# Patient Record
Sex: Male | Born: 1995
Health system: Southern US, Community
[De-identification: ages and names within clinical notes are randomized; demographics above are authoritative.]

## PROBLEM LIST (undated history)

## (undated) DIAGNOSIS — L509 Urticaria, unspecified: Secondary | ICD-10-CM

## (undated) DIAGNOSIS — R519 Headache, unspecified: Secondary | ICD-10-CM

## (undated) DIAGNOSIS — L309 Dermatitis, unspecified: Secondary | ICD-10-CM

## (undated) DIAGNOSIS — R51 Headache: Secondary | ICD-10-CM

## (undated) DIAGNOSIS — J45909 Unspecified asthma, uncomplicated: Secondary | ICD-10-CM

## (undated) DIAGNOSIS — A159 Respiratory tuberculosis unspecified: Secondary | ICD-10-CM

## (undated) HISTORY — DX: Headache, unspecified: R51.9

## (undated) HISTORY — DX: Unspecified asthma, uncomplicated: J45.909

## (undated) HISTORY — DX: Urticaria, unspecified: L50.9

## (undated) HISTORY — DX: Dermatitis, unspecified: L30.9

## (undated) HISTORY — PX: NO PAST SURGERIES: SHX2092

## (undated) HISTORY — DX: Respiratory tuberculosis unspecified: A15.9

## (undated) HISTORY — DX: Headache: R51

---

## 2016-04-21 ENCOUNTER — Encounter: Payer: Self-pay | Admitting: Family Medicine

## 2016-04-21 ENCOUNTER — Ambulatory Visit (INDEPENDENT_AMBULATORY_CARE_PROVIDER_SITE_OTHER): Payer: Self-pay | Admitting: Family Medicine

## 2016-04-21 VITALS — BP 138/80 | HR 75 | Resp 12 | Ht 73.0 in | Wt 185.1 lb

## 2016-04-21 DIAGNOSIS — F524 Premature ejaculation: Secondary | ICD-10-CM

## 2016-04-21 DIAGNOSIS — M791 Myalgia, unspecified site: Secondary | ICD-10-CM

## 2016-04-21 DIAGNOSIS — J45998 Other asthma: Secondary | ICD-10-CM

## 2016-04-21 DIAGNOSIS — N4830 Priapism, unspecified: Secondary | ICD-10-CM

## 2016-04-21 DIAGNOSIS — R5383 Other fatigue: Secondary | ICD-10-CM

## 2016-04-21 LAB — COMPREHENSIVE METABOLIC PANEL
ALT: 17 U/L (ref 0–53)
AST: 23 U/L (ref 0–37)
Albumin: 4.8 g/dL (ref 3.5–5.2)
Alkaline Phosphatase: 83 U/L (ref 39–117)
BUN: 9 mg/dL (ref 6–23)
CHLORIDE: 103 meq/L (ref 96–112)
CO2: 30 mEq/L (ref 19–32)
Calcium: 10 mg/dL (ref 8.4–10.5)
Creatinine, Ser: 0.98 mg/dL (ref 0.40–1.50)
GFR: 102.36 mL/min (ref >60.00)
GLUCOSE: 74 mg/dL (ref 70–99)
POTASSIUM: 4.1 meq/L (ref 3.5–5.1)
SODIUM: 138 meq/L (ref 135–145)
Total Bilirubin: 0.7 mg/dL (ref 0.2–1.2)
Total Protein: 7.8 g/dL (ref 6.0–8.3)

## 2016-04-21 LAB — URINALYSIS, ROUTINE W REFLEX MICROSCOPIC
Bilirubin Urine: NEGATIVE
HGB URINE DIPSTICK: NEGATIVE
Ketones, ur: NEGATIVE
LEUKOCYTES UA: NEGATIVE
NITRITE: NEGATIVE
SPECIFIC GRAVITY, URINE: 1.01 (ref 1.000–1.030)
Total Protein, Urine: NEGATIVE
URINE GLUCOSE: NEGATIVE
UROBILINOGEN UA: 0.2 (ref 0.0–1.0)
pH: 7 (ref 5.0–8.0)

## 2016-04-21 LAB — CBC WITH DIFFERENTIAL/PLATELET
BASOS PCT: 0.8 % (ref 0.0–3.0)
Basophils Absolute: 0 10*3/uL (ref 0.0–0.1)
Eosinophils Absolute: 0.3 10*3/uL (ref 0.0–0.7)
Eosinophils Relative: 7.9 % — ABNORMAL HIGH (ref 0.0–5.0)
HCT: 45.9 % (ref 39.0–52.0)
Hemoglobin: 15.6 g/dL (ref 13.0–17.0)
LYMPHS ABS: 1.8 10*3/uL (ref 0.7–4.0)
Lymphocytes Relative: 42.5 % (ref 12.0–46.0)
MCHC: 34 g/dL (ref 30.0–36.0)
MCV: 85.7 fl (ref 78.0–100.0)
Monocytes Absolute: 0.5 10*3/uL (ref 0.1–1.0)
Monocytes Relative: 11 % (ref 3.0–12.0)
NEUTROS ABS: 1.6 10*3/uL (ref 1.4–7.7)
NEUTROS PCT: 37.8 % — AB (ref 43.0–77.0)
Platelets: 234 10*3/uL (ref 150.0–400.0)
RBC: 5.36 Mil/uL (ref 4.22–5.81)
RDW: 13.6 % (ref 11.5–15.5)
WBC: 4.3 10*3/uL (ref 4.0–10.5)

## 2016-04-21 LAB — TSH: TSH: 3.47 u[IU]/mL (ref 0.35–4.50)

## 2016-04-21 MED ORDER — SERTRALINE HCL 50 MG PO TABS: 50.0000 mg | ORAL_TABLET | Freq: Every day | ORAL | 2 refills | Status: DC

## 2016-04-21 MED ORDER — ALBUTEROL SULFATE HFA 108 (90 BASE) MCG/ACT IN AERS: 2.0000 | INHALATION_SPRAY | Freq: Four times a day (QID) | RESPIRATORY_TRACT | 0 refills | Status: DC | PRN

## 2016-04-21 NOTE — Patient Instructions (Addendum)
A few things to remember from today's visit:   Myalgia - Plan: CBC with Differential/Platelet, Comprehensive metabolic panel, TSH, Urinalysis, Routine w reflex microscopic  Premature ejaculation  Painful penile erection - Plan: Chlamydia/Gonococcus/Trichomonas, NAA  Fatigue, unspecified type - Plan: CBC with Differential/Platelet, Comprehensive metabolic panel, TSH, Urinalysis, Routine w reflex microscopic  Start Sertraline 1/2 tab for a week then increase to the whole tab.  If you change your mind about urology referral please let me know.    Please be sure medication list is accurate. If a new problem present, please set up appointment sooner than planned today.

## 2016-04-21 NOTE — Progress Notes (Signed)
Pre visit review using our clinic review tool, if applicable. No additional management support is needed unless otherwise documented below in the visit note. 

## 2016-04-21 NOTE — Progress Notes (Signed)
HPI:   [name] is a 21 y.o. male, who is here today to establish care with me.  Former PCP: N/A, he has ben in [place] since 06/2015,moved from [place] Last preventive routine visit: Has not had one in may years.  Chronic medical problems: Asthma. For the past 4 months symptoms have been worse. + Nocturnal wheezing. He denies cough or dyspnea. He does not have Albuterol inh.  No recent illness.  Concerns today:   -Abdominal pain: 5 years of epigastric abdominal pain, intermittent, "soreness." Occasionally he has nausea.  He has not identified exacerbating or alleviating factors. "Not bad",it can happen q 2 weeks and months in between with no symptoms. States that he feels like he needs to scratch under his skin, no rash or skin lesions. No numbness or tingling. It seems to be stable.   Denies vomiting, changes in bowel habits, blood in stool or melena.  He has not tried OTC medication.  -Myalgias and fatigue:  Achy sensation "all over",usually exacerbated by moderate physical activity the day before symptoms onset. 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 States that sometimes this even happen with no physical activity at all. He denies joint edema or erythema. Denies limitation of ROM. He exercises regularly.  He denies Hx of sickle cells disease.   -"Genital" problems: He states that at age [age] his external genitals stopped "developing." He thinks his penis is small, he has pubic hair. 5-6 years of testicular pain, intermittently, exacerbated by palpation and by erections. He describes pain as "bad", he denies edema or erythema, he has not noted genital lesions, he denies any history of STDs.  He is also complaining of premature ejaculation, states that he has not been able to have sex intercourse. He denies urethral discharge or blood in semen. He denies dysuria,gross hematuria,or other urinary  symptoms. Denies decreased libido.  He denies anxiety or depression.  FHx negative for congenital muscle disease.   Review of Systems  Constitutional: Positive for fatigue. Negative for appetite change and fever.  HENT: Negative for mouth sores, nosebleeds and trouble swallowing.   Respiratory: Positive for wheezing. Negative for cough and shortness of breath.   Cardiovascular: Negative for chest pain, palpitations and leg swelling.  Gastrointestinal: Positive for abdominal pain and nausea. Negative for blood in stool and vomiting.       No changes in bowel habits.  Endocrine: Negative for cold intolerance and heat intolerance.  Genitourinary: Positive for testicular pain. Negative for decreased urine volume, discharge, dysuria, flank pain, frequency, genital sores, hematuria, scrotal swelling and urgency.  Musculoskeletal: Positive for myalgias. Negative for back pain and gait problem.  Skin: Negative for pallor and rash.  Allergic/Immunologic: Positive for environmental allergies.  Neurological: Negative for syncope, weakness and headaches.  Hematological: Negative for adenopathy. Does not bruise/bleed easily.  Psychiatric/Behavioral: Negative for confusion and sleep disturbance. The patient is nervous/anxious.     No current outpatient prescriptions on file prior to visit.   No current facility-administered medications on file prior to visit.      Past Medical History:  Diagnosis Date  . Asthma   . Frequent headaches    Not on File  Family History  Problem Relation Age of Onset  . Cancer Neg Hx   . Heart disease Neg Hx   . Hypertension Neg Hx   . Hyperlipidemia Neg Hx   . Depression Neg Hx     Social  History   Social History  . Marital status: Single    Spouse name: N/A  . Number of children: N/A  . Years of education: N/A   Social History Main Topics  . Smoking status: Never Smoker  . Smokeless tobacco: Never Used  . Alcohol use No  . Drug use: No  .  Sexual activity: Not Currently   Other Topics Concern  . None   Social History Narrative  . None    Vitals:   04/21/16 1152  BP: 138/80  Pulse: 75  Resp: 12   O2 sat at RA 98%. Body mass index is 24.42 kg/m.  Physical Exam  Nursing note and vitals reviewed. Constitutional: He is oriented to person, place, and time. He appears well-developed and well-nourished. No distress.  HENT:  Head: Atraumatic.  Eyes: Conjunctivae and EOM are normal.  Neck: No tracheal deviation present. No thyroid mass and no thyromegaly present.  Cardiovascular:  Pulses:      Dorsalis pedis pulses are 2+ on the right side, and 2+ on the left side.  Respiratory: Effort normal and breath sounds normal. No respiratory distress.  GI: Soft. He exhibits no mass. There is no hepatomegaly. There is no tenderness. There is no CVA tenderness. Hernia confirmed negative in the right inguinal area and confirmed negative in the left inguinal area.  Genitourinary: Penis normal. Right testis shows no mass, no swelling and no tenderness. Left testis shows no mass, no swelling and no tenderness. No penile tenderness. No discharge found.  Musculoskeletal: Normal range of motion. He exhibits no edema.  No tenderness upon palpation of paraspinal muscles (cervical,thoracic,or lumbar), no trigger points. No signs of synovitis.  Lymphadenopathy:    He has no cervical adenopathy.       Right: No inguinal and no supraclavicular adenopathy present.       Left: No inguinal and no supraclavicular adenopathy present.  Neurological: He is alert and oriented to person, place, and time. He has normal strength. No cranial nerve deficit. Coordination and gait normal.  Skin: Skin is warm. No rash noted. No erythema.  Psychiatric: His mood appears anxious.  Well groomed,good eye contact.    ASSESSMENT AND PLAN:   Jeffrey Hall was seen today for establish care.  Diagnoses and all orders for this visit:  Myalgia  Musculoskeletal  examination today normal otherwise. Further recommendations will be given according to lab results. Zoloft may help.  -     CBC with Differential/Platelet -     Comprehensive metabolic panel -     TSH -     Urinalysis, Routine w reflex microscopic  Premature ejaculation  He agrees with trying Zoloft, instructed to start 1/2 tab for a week and increase to 50 mg if well tolerated. some side effects discussed. F/U in 2 months.   -     sertraline (ZOLOFT) 50 MG tablet; Take 1 tablet (50 mg total) by mouth daily.  Painful penile erection  No pain on examination today. He has no health insurance,so he prefers to hold on urology evaluation.  -     Chlamydia/Gonococcus/Trichomonas, NAA  Fatigue, unspecified type  We discussed possible causes. ? Anxiety. Further recommendations will be given according to lab results.   -     CBC with Differential/Platelet -     Comprehensive metabolic panel -     TSH -     Urinalysis, Routine w reflex microscopic  Asthma, persistent not controlled  Not well controlled. OTC Cetirizine 10 mg daily. Albuterol  inh 2 puff every 6 hours for a week then as needed for wheezing or shortness of breath.  Will consider QVAR if symptoms are not improved.  -     albuterol (PROVENTIL HFA;VENTOLIN HFA) 108 (90 Base) MCG/ACT inhaler; Inhale 2 puffs into the lungs every 6 (six) hours as needed for wheezing or shortness of breath.      Jeffrey Wilner G. [place], MD  [facility]. [place] office.

## 2016-04-23 LAB — CHLAMYDIA/GONOCOCCUS/TRICHOMONAS, NAA
Chlamydia by NAA: NEGATIVE
Gonococcus by NAA: NEGATIVE
TRICH VAG BY NAA: NEGATIVE

## 2016-04-24 ENCOUNTER — Encounter: Payer: Self-pay | Admitting: Family Medicine

## 2016-07-21 ENCOUNTER — Other Ambulatory Visit: Payer: Self-pay | Admitting: Occupational Medicine

## 2016-07-21 ENCOUNTER — Ambulatory Visit (HOSPITAL_COMMUNITY)
Admission: RE | Admit: 2016-07-21 | Discharge: 2016-07-21 | Disposition: A | Discharge status: Home / Self Care | Payer: Self-pay | Source: Ambulatory Visit | Attending: Occupational Medicine | Admitting: Occupational Medicine

## 2016-07-21 DIAGNOSIS — A159 Respiratory tuberculosis unspecified: Secondary | ICD-10-CM

## 2016-10-05 ENCOUNTER — Encounter: Payer: Self-pay | Admitting: Family Medicine

## 2016-11-21 ENCOUNTER — Encounter: Payer: Self-pay | Admitting: Family Medicine

## 2016-11-21 ENCOUNTER — Ambulatory Visit (INDEPENDENT_AMBULATORY_CARE_PROVIDER_SITE_OTHER): Payer: Self-pay | Admitting: Family Medicine

## 2016-11-21 VITALS — BP 124/78 | HR 80 | Temp 98.7°F | Resp 12 | Ht 73.0 in | Wt 164.2 lb

## 2016-11-21 DIAGNOSIS — F411 Generalized anxiety disorder: Secondary | ICD-10-CM

## 2016-11-21 DIAGNOSIS — R6889 Other general symptoms and signs: Secondary | ICD-10-CM

## 2016-11-21 DIAGNOSIS — J309 Allergic rhinitis, unspecified: Secondary | ICD-10-CM | POA: Insufficient documentation

## 2016-11-21 DIAGNOSIS — R0602 Shortness of breath: Secondary | ICD-10-CM

## 2016-11-21 DIAGNOSIS — K5900 Constipation, unspecified: Secondary | ICD-10-CM

## 2016-11-21 DIAGNOSIS — R5382 Chronic fatigue, unspecified: Secondary | ICD-10-CM

## 2016-11-21 MED ORDER — SERTRALINE HCL 50 MG PO TABS: 50.0000 mg | ORAL_TABLET | Freq: Every day | ORAL | 2 refills | Status: DC

## 2016-11-21 NOTE — Patient Instructions (Addendum)
A few things to remember from today's visit:   GAD (generalized anxiety disorder)  Allergic rhinitis, unspecified seasonality, unspecified trigger  Constipation, unspecified constipation type  Shortness of breath  Chronic fatigue  Miralax daily and Dulcolax 5 mg daily as needed for constipation. Zyrtec 10 mg daily for allergies.  Resumed Zoloft.   Please be sure medication list is accurate. If a new problem present, please set up appointment sooner than planned today.

## 2016-11-21 NOTE — Progress Notes (Signed)
HPI   Jeffrey Hall is a 21 y.o. male, who is here today to follow on several concerns, some were discussed last OV. He was supposed to have a 2 months follow up after his last OV on 04/21/16.   Problem with eyes, red, "I am not comfortable looking at you." Pruritic eyes and epiphora. Hx of allergies and asthma, he is not taking Cetirizine.  Today he is C/O 6-7 years of:"Fatigue,lack of sleep,bones pain,headaches, back pains , nausea, joint pain s(hip,knee,ankles,and IP), itchy body ("inside"),no appetite, breathing problems,constipation,internal wounds (bad smelling from mouth and pelvic),warm body,anxiety,depression,wt loss, baby face, inflammation,and yellow skin/jaundice" among some. He states that all these symptoms improved some with healthy diet.  Constipation: He is having bowel movements 1-3 per day if he follows a healthy diet. 3-4 days and bulky and dyschezia.  He changed his diet, increased fiber intake,eating more vegetables.  Lab Results  Component Value Date   TSH 3.47 04/21/2016   Lab Results  Component Value Date   ALT 17 04/21/2016   AST 23 04/21/2016   ALKPHOS 83 04/21/2016   BILITOT 0.7 04/21/2016   Lab Results  Component Value Date   CREATININE 0.98 04/21/2016   BUN 9 04/21/2016   NA 138 04/21/2016   K 4.1 04/21/2016   CL 103 04/21/2016   CO2 30 04/21/2016   Lab Results  Component Value Date   WBC 4.3 04/21/2016   HGB 15.6 04/21/2016   HCT 45.9 04/21/2016   MCV 85.7 04/21/2016   PLT 234.0 04/21/2016      Anxiety and depression: irritable, "mad", does not want to talk with anybody. Last office visit Sertraline was recommended, he denies any side effect and felt like medication was helping with anxiety and in general he felt.  He discontinued medication because he ran out and did not keep follow-up appointment. Medication was also prescribed because he reported premature ejaculation.   He denies current suicidal thoughts but he  has had some in the past, he denies suicidal ideation or suicidal plan. He has not noted visual or auditive hallucinations. + Insomnia.  Wt loss, not sure about how many Lb.  He stopped eating fast food but he doe snot think this is causing wt loss.  Skin "discoloration", constant "yellowish" skin. He denies skin lesions,rash, or edema. He states that his skin is "not normal."  "Breathing trouble", nasal congestion.  He denies exertional dyspnea or chest pain. He has not noted cough or wheezing. Denies heartburn.  Generalized myalgias and arthralgias, "abd" and constant. He has not noted joint erythema or edema.  He denies limitation of range of motion.  He thinks he has problems with testosterone because issues with his libido, small genitalia, "muscles build", and "baby face." Thin and small amount of body hair,mainly on face.    Review of Systems  Constitutional: Positive for appetite change, fatigue and unexpected weight change. Negative for activity change and fever.  HENT: Positive for congestion. Negative for facial swelling, mouth sores, nosebleeds, sore throat and trouble swallowing.   Eyes: Positive for discharge, redness and itching. Negative for visual disturbance.  Respiratory: Positive for shortness of breath. Negative for cough and wheezing.   Cardiovascular: Negative for chest pain, palpitations and leg swelling.  Gastrointestinal: Positive for constipation and nausea. Negative for abdominal pain, blood in stool and vomiting.  Endocrine: Negative for cold intolerance, heat intolerance, polydipsia, polyphagia and polyuria.  Genitourinary: Negative for decreased urine volume, discharge, dysuria, flank pain, genital sores,  hematuria and testicular pain.  Musculoskeletal: Positive for arthralgias, back pain and myalgias. Negative for joint swelling.  Skin: Negative for rash and wound.  Allergic/Immunologic: Positive for environmental allergies.  Neurological: Positive for  headaches. Negative for dizziness, seizures, syncope, weakness and numbness.  Hematological: Negative for adenopathy. Does not bruise/bleed easily.  Psychiatric/Behavioral: Positive for decreased concentration and sleep disturbance. Negative for confusion, hallucinations and self-injury. The patient is nervous/anxious.       Current Outpatient Medications on File Prior to Visit  Medication Sig Dispense Refill  . albuterol (PROVENTIL HFA;VENTOLIN HFA) 108 (90 Base) MCG/ACT inhaler Inhale 2 puffs into the lungs every 6 (six) hours as needed for wheezing or shortness of breath. (Patient not taking: Reported on 11/21/2016) 1 Inhaler 0   No current facility-administered medications on file prior to visit.      Past Medical History:  Diagnosis Date  . Asthma   . Frequent headaches    Not on File  Social History   Socioeconomic History  . Marital status: Single    Spouse name: None  . Number of children: None  . Years of education: None  . Highest education level: None  Social Needs  . Financial resource strain: None  . Food insecurity - worry: None  . Food insecurity - inability: None  . Transportation needs - medical: None  . Transportation needs - non-medical: None  Occupational History  . None  Tobacco Use  . Smoking status: Never Smoker  . Smokeless tobacco: Never Used  Substance and Sexual Activity  . Alcohol use: No  . Drug use: No  . Sexual activity: Not Currently  Other Topics Concern  . None  Social History Narrative  . None    Vitals:   11/21/16 1031  BP: 124/78  Pulse: 80  Resp: 12  Temp: 98.7 F (37.1 C)  SpO2: 99%   Body mass index is 21.67 kg/m.   Physical Exam  Nursing note and vitals reviewed. Constitutional: He is oriented to person, place, and time. He appears well-developed and well-nourished. No distress.  HENT:  Head: Normocephalic and atraumatic.  Mouth/Throat: Uvula is midline, oropharynx is clear and moist and mucous membranes are  normal.  Eyes: Conjunctivae and EOM are normal. Pupils are equal, round, and reactive to light.  Neck: No JVD present. No tracheal deviation present. No thyroid mass and no thyromegaly present.  Cardiovascular: Normal rate and regular rhythm.  No murmur heard. Pulses:      Dorsalis pedis pulses are 2+ on the right side, and 2+ on the left side.  Respiratory: Effort normal and breath sounds normal. No respiratory distress.  GI: Soft. He exhibits no mass. There is no hepatosplenomegaly. There is no tenderness.  Musculoskeletal: He exhibits no edema or tenderness.  No deformities or signs of synovitis appreciated.  No limitation of ROM.  Lymphadenopathy:    He has no cervical adenopathy.       Right: No supraclavicular adenopathy present.       Left: No supraclavicular adenopathy present.  Neurological: He is alert and oriented to person, place, and time. He has normal strength.  Skin: Skin is warm and intact. No rash noted. No erythema.  Psychiatric: His speech is normal. His mood appears anxious. Cognition and memory are normal. He expresses no suicidal ideation. He expresses no suicidal plans.  Well groomed, poor eye contact.    ASSESSMENT AND PLAN:   Mr. Briant SitesMaaroufou was seen today for consulatation.  Diagnoses and all orders for this  visit:  Chronic fatigue  We discussed possible etiologies: Systemic illness, immunologic,endocrinology,sleep disorder, psychiatric/psychologic, infectious,medications side effects, and idiopathic. Examination today does not suggest a serious process. ? Psychiatric. Healthy diet and regular physical activity may help.  He has had blood work-up done last OV.  Today future labs placed, further recommendations will be given according to lab results.  -     Testosterone; Future -     C-reactive protein; Future -     Sedimentation rate; Future  Constipation, unspecified constipation type  We discussed treatment options, recommend OTC MiraLAX and  Dulcolax. Continue high fiber and adequate fluid intake.  Allergic rhinitis, unspecified seasonality, unspecified trigger  Some of his symptomatology could be related to allergies. Recommend OTC Zyrtec 10 mg daily. Intranasal steroid might also help.  Shortness of breath  We discussed possible causes. At this time he does not have health insurance, so he would like to hold on chest imaging. Today lung auscultation is negative.  GAD (generalized anxiety disorder)  He is very anxious, I wondered about and another psychotic disorder.  Recommend psychiatric evaluation, he does not have health insurance at this time but is planning on getting it the beginning of next year. He tolerated sertraline well, so recommend resuming medication. He was clearly instructed about warning signs, including suicidal thoughts. Follow-up in 4 weeks, before if needed.  -     sertraline (ZOLOFT) 50 MG tablet; Take 1 tablet (50 mg total) daily by mouth.  Multiple somatic complaints  Physical examination otherwise normal. He has had lab work done and he has been negative. He reports HIV test done during immigration process and negative. I tried to reassure him based on exam and prior labs but he is convinced "something is wrong."     -Mr. Fern Balcerzak was advised to return sooner than planned today if new concerns arise.       Duwayne Matters G. [place], MD  [facility]. [place] office.

## 2016-11-23 ENCOUNTER — Other Ambulatory Visit (INDEPENDENT_AMBULATORY_CARE_PROVIDER_SITE_OTHER): Payer: Self-pay

## 2016-11-23 DIAGNOSIS — R5382 Chronic fatigue, unspecified: Secondary | ICD-10-CM

## 2016-11-23 LAB — TESTOSTERONE: Testosterone: 443.82 ng/dL (ref 300.00–890.00)

## 2016-11-23 LAB — SEDIMENTATION RATE: Sed Rate: 2 mm/hr (ref 0–15)

## 2016-11-23 LAB — C-REACTIVE PROTEIN

## 2016-11-24 ENCOUNTER — Encounter: Payer: Self-pay | Admitting: Family Medicine

## 2016-11-27 ENCOUNTER — Encounter: Payer: Self-pay | Admitting: Family Medicine

## 2016-12-04 ENCOUNTER — Other Ambulatory Visit: Payer: Self-pay | Admitting: Family Medicine

## 2016-12-04 DIAGNOSIS — J45998 Other asthma: Secondary | ICD-10-CM

## 2016-12-04 MED ORDER — ALBUTEROL SULFATE HFA 108 (90 BASE) MCG/ACT IN AERS: 2.0000 | INHALATION_SPRAY | Freq: Four times a day (QID) | RESPIRATORY_TRACT | 0 refills | Status: DC | PRN

## 2016-12-25 ENCOUNTER — Ambulatory Visit: Payer: Self-pay | Admitting: Family Medicine

## 2016-12-29 ENCOUNTER — Encounter: Payer: Self-pay | Admitting: Family Medicine

## 2016-12-29 ENCOUNTER — Ambulatory Visit (INDEPENDENT_AMBULATORY_CARE_PROVIDER_SITE_OTHER): Payer: Self-pay | Admitting: Family Medicine

## 2016-12-29 VITALS — BP 125/77 | HR 63 | Temp 98.2°F | Resp 16 | Ht 73.0 in | Wt 171.5 lb

## 2016-12-29 DIAGNOSIS — J309 Allergic rhinitis, unspecified: Secondary | ICD-10-CM

## 2016-12-29 DIAGNOSIS — F411 Generalized anxiety disorder: Secondary | ICD-10-CM

## 2016-12-29 DIAGNOSIS — J45998 Other asthma: Secondary | ICD-10-CM

## 2016-12-29 MED ORDER — SERTRALINE HCL 100 MG PO TABS: 100.0000 mg | ORAL_TABLET | Freq: Every day | ORAL | 3 refills | Status: DC

## 2016-12-29 MED ORDER — FLUTICASONE PROPIONATE HFA 110 MCG/ACT IN AERO: 1.0000 | INHALATION_SPRAY | Freq: Two times a day (BID) | RESPIRATORY_TRACT | 12 refills | Status: DC

## 2016-12-29 MED ORDER — ALBUTEROL SULFATE HFA 108 (90 BASE) MCG/ACT IN AERS: 2.0000 | INHALATION_SPRAY | Freq: Four times a day (QID) | RESPIRATORY_TRACT | 1 refills | Status: DC | PRN

## 2016-12-29 NOTE — Patient Instructions (Signed)
A few things to remember from today's visit:   Allergic rhinitis, unspecified seasonality, unspecified trigger  Asthma, persistent not controlled - Plan: albuterol (PROVENTIL HFA;VENTOLIN HFA) 108 (90 Base) MCG/ACT inhaler  Generalized anxiety disorder  Today Flovent started to take daily 2 times. Albuterol inh as needed. Sertraline increased from 50 mg to 100 mg.  Flu vaccine I 01/2017 due to insurance.   Please be sure medication list is accurate. If a new problem present, please set up appointment sooner than planned today.

## 2016-12-29 NOTE — Progress Notes (Signed)
HPI:   Jeffrey Hall is a 21 y.o. male, who is here today to follow on recent OV.   He was seen on 11/21/16, when he had multiple complaints. Allergic rhinitis and asthma: He started Cetirizine 10 mg and Albuterol inh, the latter one he is using q 6 hours and has helped with dyspnea. Eye epiphora,pruritus,exertional dyspnea,and wheezing have improved.  Anxiety: He resumed Sertraline 50 mg, he noted that most of his symptoms improved,including: "yellowish skin",premature ejaculation,headache, fatigue. He tells me that even though symptoms improved he did not feel as much improvement as he did when he first took Sertraline ( a few months ago). He denies suicidal thoughts or insomnia.  He is eating healthier. He is not exercising regularly. Started working and will have health insurance in 01/2017.  Overall he is feeling better, no new symptoms reported.    Review of Systems  Constitutional: Positive for fatigue (improved.). Negative for activity change, appetite change and fever.  HENT: Negative for mouth sores, nosebleeds and trouble swallowing.   Eyes: Negative for redness and visual disturbance.  Respiratory: Negative for cough and wheezing.   Cardiovascular: Negative for chest pain, palpitations and leg swelling.  Gastrointestinal: Negative for abdominal pain, diarrhea, nausea and vomiting.  Genitourinary: Negative for decreased urine volume and hematuria.  Musculoskeletal: Negative for arthralgias, gait problem and joint swelling.  Allergic/Immunologic: Positive for environmental allergies.  Neurological: Negative for tremors, syncope and weakness.  Hematological: Negative for adenopathy. Does not bruise/bleed easily.  Psychiatric/Behavioral: Negative for agitation, confusion, hallucinations, self-injury, sleep disturbance and suicidal ideas. The patient is nervous/anxious.       No current outpatient medications on file prior to visit.   No current  facility-administered medications on file prior to visit.      Past Medical History:  Diagnosis Date  . Asthma   . Frequent headaches    Not on File  Social History   Socioeconomic History  . Marital status: Single    Spouse name: None  . Number of children: None  . Years of education: None  . Highest education level: None  Social Needs  . Financial resource strain: None  . Food insecurity - worry: None  . Food insecurity - inability: None  . Transportation needs - medical: None  . Transportation needs - non-medical: None  Occupational History  . None  Tobacco Use  . Smoking status: Never Smoker  . Smokeless tobacco: Never Used  Substance and Sexual Activity  . Alcohol use: No  . Drug use: No  . Sexual activity: Not Currently  Other Topics Concern  . None  Social History Narrative  . None    Vitals:   12/29/16 0859  BP: 125/77  Pulse: 63  Resp: 16  Temp: 98.2 F (36.8 C)  SpO2: 98%   Body mass index is 22.63 kg/m.  Wt Readings from Last 3 Encounters:  12/29/16 171 lb 8 oz (77.8 kg)  11/21/16 164 lb 4 oz (74.5 kg)  04/21/16 185 lb 2 oz (84 kg)     Physical Exam  Nursing note and vitals reviewed. Constitutional: He is oriented to person, place, and time. He appears well-developed and well-nourished. No distress.  HENT:  Head: Normocephalic and atraumatic.  Mouth/Throat: Oropharynx is clear and moist and mucous membranes are normal.  Eyes: Conjunctivae are normal. Pupils are equal, round, and reactive to light.  Cardiovascular: Normal rate and regular rhythm.  No murmur heard. Pulses:      Dorsalis pedis  pulses are 2+ on the right side, and 2+ on the left side.  Respiratory: Effort normal and breath sounds normal. No respiratory distress.  GI: Soft. He exhibits no mass. There is no hepatomegaly. There is no tenderness.  Musculoskeletal: Normal range of motion. He exhibits no edema or tenderness.  No signs of synovitis.  Lymphadenopathy:    He  has no cervical adenopathy.  Neurological: He is alert and oriented to person, place, and time. He has normal strength. Gait normal.  Skin: Skin is warm. No rash noted. No erythema.  Psychiatric: His mood appears anxious. Cognition and memory are normal. He expresses no suicidal ideation. He expresses no suicidal plans.  Well groomed, good eye contact.    ASSESSMENT AND PLAN:   [name] was seen today for follow-up.  Diagnoses and all orders for this visit:  Asthma, persistent not controlled  Improved but still feels like he needs Albuterol inh a few times per day, so Flovent inh added. Continue Albuterol inh just as needed. F/U in 6 weeks.  -     fluticasone (FLOVENT HFA) 110 MCG/ACT inhaler; Inhale 1 puff into the lungs 2 (two) times daily. -     albuterol (PROVENTIL HFA;VENTOLIN HFA) 108 (90 Base) MCG/ACT inhaler; Inhale 2 puffs into the lungs every 6 (six) hours as needed for wheezing or shortness of breath.  Allergic rhinitis, unspecified seasonality, unspecified trigger  Improved. Continue Cetirizine 10 mg daily. Nasal irrigations with saline as needed.  Generalized anxiety disorder  Improved. Sertraline increased from 50 mg to 100 mg.Some side effects discussed.  Instructed about warning signs. F/U in 4-6 weeks,before if needed.  -     sertraline (ZOLOFT) 100 MG tablet; Take 1 tablet (100 mg total) by mouth daily.      Betty G. [place], MD  [facility]. [place] office.

## 2017-01-22 ENCOUNTER — Encounter: Payer: Self-pay | Admitting: Family Medicine

## 2017-01-22 ENCOUNTER — Ambulatory Visit: Payer: BLUE CROSS/BLUE SHIELD | Admitting: Family Medicine

## 2017-01-22 VITALS — BP 124/80 | HR 81 | Temp 98.6°F | Resp 12 | Ht 73.0 in | Wt 171.5 lb

## 2017-01-22 DIAGNOSIS — M791 Myalgia, unspecified site: Secondary | ICD-10-CM | POA: Insufficient documentation

## 2017-01-22 DIAGNOSIS — F411 Generalized anxiety disorder: Secondary | ICD-10-CM | POA: Diagnosis not present

## 2017-01-22 DIAGNOSIS — J309 Allergic rhinitis, unspecified: Secondary | ICD-10-CM

## 2017-01-22 DIAGNOSIS — Z23 Encounter for immunization: Secondary | ICD-10-CM

## 2017-01-22 DIAGNOSIS — R5383 Other fatigue: Secondary | ICD-10-CM

## 2017-01-22 LAB — TSH: TSH: 2.8 u[IU]/mL (ref 0.35–4.50)

## 2017-01-22 LAB — T3, FREE: T3, Free: 3.4 pg/mL (ref 2.3–4.2)

## 2017-01-22 LAB — CK: Total CK: 220 U/L (ref 7–232)

## 2017-01-22 LAB — T4, FREE: FREE T4: 0.71 ng/dL (ref 0.60–1.60)

## 2017-01-22 MED ORDER — DULOXETINE HCL 30 MG PO CPEP: 30.0000 mg | ORAL_CAPSULE | Freq: Every day | ORAL | 1 refills | Status: DC

## 2017-01-22 NOTE — Progress Notes (Signed)
HPI:   [name] is a 22 y.o. male, who is here today to follow on recent OV.   He was seen on 12/29/16.  Hx of multisystem complaints: Fatigue,SOB,myalgias,arthralgias,coloration skin changes,premature ejaculation.He feels like his muscles are not "bid" enough as well as his external genitalia.  . Last OV he reported improvement of almost all symptoms with Zoloft , today he reports re-occurrence of all. Red eyes and pruritus. He is on Cetirizine.  +Sneezing.  SOB "every day" even at rest. "Not much" cough. Today he is requesting adrenal and thyroid testing, Requesting referral to "allergy specialist", and changing Zoloft to a different medication.  He states that Zoloft is not longer helping. Denies suicidal ideation but sometimes he has thoughts that he would be better off dead due to " my illness."  Insomnia, trouble falling sleep and sometimes sleeps long and still feels fatigue.   Lab Results  Component Value Date   TSH 3.47 04/21/2016    "I do not have muscles" even though he is lifting wts.  He cannot work long ahours or do heavy lifting because tireness, muscle pain, and cervicalgia.     Review of Systems  Constitutional: Positive for fatigue. Negative for activity change, appetite change, fever and unexpected weight change.  HENT: Positive for congestion, rhinorrhea and sneezing. Negative for nosebleeds, sore throat and trouble swallowing.   Eyes: Positive for itching. Negative for redness and visual disturbance.  Respiratory: Positive for shortness of breath. Negative for cough and wheezing.   Cardiovascular: Negative for chest pain, palpitations and leg swelling.  Gastrointestinal: Negative for abdominal pain, blood in stool, nausea and vomiting.  Endocrine: Negative for cold intolerance, heat intolerance, polydipsia, polyphagia and polyuria.  Genitourinary: Negative for decreased urine volume, dysuria, genital sores, hematuria and penile  swelling.  Musculoskeletal: Positive for arthralgias, back pain, myalgias and neck pain. Negative for gait problem and joint swelling.  Skin: Negative for rash and wound.  Allergic/Immunologic: Positive for environmental allergies.  Neurological: Negative for seizures, syncope, weakness, numbness and headaches.  Hematological: Negative for adenopathy. Does not bruise/bleed easily.  Psychiatric/Behavioral: Positive for sleep disturbance. Negative for confusion, hallucinations and suicidal ideas. The patient is nervous/anxious.       Current Outpatient Medications on File Prior to Visit  Medication Sig Dispense Refill  . albuterol (PROVENTIL HFA;VENTOLIN HFA) 108 (90 Base) MCG/ACT inhaler Inhale 2 puffs into the lungs every 6 (six) hours as needed for wheezing or shortness of breath. 1 Inhaler 1  . fluticasone (FLOVENT HFA) 110 MCG/ACT inhaler Inhale 1 puff into the lungs 2 (two) times daily. (Patient not taking: Reported on 01/22/2017) 1 Inhaler 12   No current facility-administered medications on file prior to visit.      Past Medical History:  Diagnosis Date  . Asthma   . Frequent headaches    Not on File  Social History   Socioeconomic History  . Marital status: Single    Spouse name: None  . Number of children: None  . Years of education: None  . Highest education level: None  Social Needs  . Financial resource strain: None  . Food insecurity - worry: None  . Food insecurity - inability: None  . Transportation needs - medical: None  . Transportation needs - non-medical: None  Occupational History  . None  Tobacco Use  . Smoking status: Never Smoker  . Smokeless tobacco: Never Used  Substance and Sexual Activity  . Alcohol use: No  . Drug use:  No  . Sexual activity: Not Currently  Other Topics Concern  . None  Social History Narrative  . None    Vitals:   01/22/17 0800  BP: 124/80  Pulse: 81  Resp: 12  Temp: 98.6 F (37 C)  SpO2: 99%   Body mass index  is 22.63 kg/m. Wt Readings from Last 3 Encounters:  01/22/17 171 lb 8 oz (77.8 kg)  12/29/16 171 lb 8 oz (77.8 kg)  11/21/16 164 lb 4 oz (74.5 kg)     Physical Exam  Nursing note and vitals reviewed. Constitutional: He is oriented to person, place, and time. He appears well-developed. No distress.  HENT:  Head: Normocephalic and atraumatic.  Mouth/Throat: Oropharynx is clear and moist and mucous membranes are normal.  Eyes: Conjunctivae and EOM are normal. Pupils are equal, round, and reactive to light.  Neck: No tracheal deviation present. No thyroid mass and no thyromegaly present.  Cardiovascular: Normal rate and regular rhythm.  No murmur heard. Pulses:      Dorsalis pedis pulses are 2+ on the right side, and 2+ on the left side.  Respiratory: Effort normal and breath sounds normal. No respiratory distress. He exhibits no tenderness.  GI: Soft. He exhibits no mass. There is no hepatomegaly. There is no tenderness.  Musculoskeletal: Normal range of motion. He exhibits no edema.  Mild scoliosis, mildly prominent right scapula. No signs of synovitis.   Lymphadenopathy:    He has no cervical adenopathy.       Right: No supraclavicular adenopathy present.       Left: No supraclavicular adenopathy present.  Neurological: He is alert and oriented to person, place, and time. He has normal strength. Coordination and gait normal.  Skin: Skin is warm. No rash noted. No erythema.  Psychiatric: His mood appears anxious. He expresses no suicidal ideation.  Well groomed, good eye contact.    ASSESSMENT AND PLAN:   Ms. Briant SitesMaaroufou was seen today for follow-up.  Diagnoses and all orders for this visit:  Fatigue, unspecified type  We again discussed possible etiologies: Systemic illness, immunologic,endocrinology,sleep disorder, psychiatric/psychologic, infectious,medications side effects, and idiopathic. Examination today does not suggest a serious process. I am highly suspicious of  psychiatric dosprder causong most of his symptoms.  Further recommendations will be given according to lab results.  -     T4, free -     T3, free -     TSH -     Cortisol, free, Serum -     CK  Allergic rhinitis, unspecified seasonality, unspecified trigger  He educated about diagnosis and reassured. He would like an appointment with immunologist, referral placed. Flonase nasal spray daily as needed. OTC daily antihistaminic my also help.  -     Ambulatory referral to Immunology  Generalized anxiety disorder  After discussion of a few treatment options, he agrees with trying Cymbalta 30 mg daily. A list of mental health providers in the area was given today, strongly recommend arrange an appointment.  Instructed about warning signs. Follow-up in 3-4 weeks.  -     DULoxetine (CYMBALTA) 30 MG capsule; Take 1 capsule (30 mg total) by mouth daily.  Myalgia  ?  Fibromyalgia. Workup has been negative so far. Cymbalta might help.  -     DULoxetine (CYMBALTA) 30 MG capsule; Take 1 capsule (30 mg total) by mouth daily. -     CK  Need for influenza vaccination -     Flu Vaccine QUAD 36+ mos IM  Betty G. Martinique, MD  [facility]. [place] office.

## 2017-01-22 NOTE — Patient Instructions (Signed)
A few things to remember from today's visit:   Allergic rhinitis, unspecified seasonality, unspecified trigger - Plan: Ambulatory referral to Immunology  Generalized anxiety disorder - Plan: DULoxetine (CYMBALTA) 30 MG capsule  Myalgia - Plan: CK  Fatigue, unspecified type - Plan: T4, free, T3, free, TSH, Cortisol, free, Serum, CK  PSYCJHIATRIC OFFICES AND PSYCHIATRISTS IN THE AREA   When psychiatric evaluation is discussed or recommended referral is not necessary. This is a list of options around [place], [place] that you can call and arrange an appointment.  -[facility] [phone] -[facility] [phone] [facility]- [facility] [phone] -[facility] [phone]  Dr Annabell SabalMichael F lefaive [phone] Dr Marguerita BeardsKenneth J. Headen, MD [phone] Dr Milagros Evenerupinder Kaur, MD [phone]   Dr Jacqulynn Cadetarey G. Bernardo Heaterottle Jr, MD [phone] Dr Madie RenoIrving A. Lugo  Dr Andee PolesMcKinney, [place], MD [phone]  Dr Mar DaringWangelin, Robert, MD [phone]   Please be sure medication list is accurate. If a new problem present, please set up appointment sooner than planned today.

## 2017-01-23 ENCOUNTER — Encounter: Payer: Self-pay | Admitting: Family Medicine

## 2017-01-27 ENCOUNTER — Encounter: Payer: Self-pay | Admitting: Family Medicine

## 2017-01-27 LAB — CORTISOL, FREE: Cortisol Free, Ser: 0.68 ug/dL

## 2017-02-19 NOTE — Progress Notes (Signed)
HPI:   Jeffrey Hall is a 22 y.o. male, who is here today to follow on recent OV.   He was seen on 01/22/2017.  He has history of anxiety disorder, usually he has multiple complaints.    Last visit he was referred to immunologist upon request because persistent conjunctival erythema and pruritus as well as sneezing.  He has history of asthma and allergic rhinitis.Currently he is on Albuterol inh and Zyrtec 10 mg daily.  Albuterol inh helps some but he is still having episodes of dyspnea,even at rest. No associated cough or wheezing since his last OV.   Anxiety:  Zoloft was discontinued because it was not helping as much as it did when he first started. Because complaining about myalgias as well as arthralgias, we started Cymbalta 30 mg daily. I also provided him with a list of mental health providers in the area and encouraged him to establish with one. He has an appt with psychiatrist today. Cymbalta has helped "so so." He denies side effects. Still feels fatigue and myalgias. He denies suicidal thoughts.   He tells me that his problems are "in my head and my heart."Today he is not reporting headaches  Palpitations, "racing heart",mainly at night when he is in bed. SOB and chest "hurting", bilateral,not radiated, at rest and with exertion. He denies heartburn but in the past he has reported GI symptoms.  Feeling "hot","my body never cools down."  Lab Results  Component Value Date   TSH 2.80 01/22/2017   T3 and T4 also normal. Lab Results  Component Value Date   ESRSEDRATE 2 11/23/2016   Lab Results  Component Value Date   WBC 4.3 04/21/2016   HGB 15.6 04/21/2016   HCT 45.9 04/21/2016   MCV 85.7 04/21/2016   PLT 234.0 04/21/2016      Review of Systems  Constitutional: Positive for fatigue. Negative for activity change, appetite change, fever and unexpected weight change.  HENT: Negative for nosebleeds, sore throat and trouble swallowing.   Eyes:  Negative for redness and visual disturbance.  Respiratory: Positive for shortness of breath. Negative for cough and wheezing.   Cardiovascular: Positive for chest pain. Negative for palpitations and leg swelling.  Gastrointestinal: Negative for abdominal pain, blood in stool, nausea and vomiting.  Endocrine: Positive for heat intolerance. Negative for cold intolerance, polydipsia, polyphagia and polyuria.  Genitourinary: Negative for decreased urine volume, dysuria and hematuria.  Musculoskeletal: Positive for arthralgias and myalgias. Negative for gait problem.  Skin: Negative for pallor and rash.  Allergic/Immunologic: Positive for environmental allergies.  Neurological: Negative for seizures, weakness, numbness and headaches.  Hematological: Negative for adenopathy. Does not bruise/bleed easily.  Psychiatric/Behavioral: Negative for confusion, hallucinations, sleep disturbance and suicidal ideas. The patient is nervous/anxious.       Current Outpatient Medications on File Prior to Visit  Medication Sig Dispense Refill  . albuterol (PROVENTIL HFA;VENTOLIN HFA) 108 (90 Base) MCG/ACT inhaler Inhale 2 puffs into the lungs every 6 (six) hours as needed for wheezing or shortness of breath. 1 Inhaler 1  . fluticasone (FLOVENT HFA) 110 MCG/ACT inhaler Inhale 1 puff into the lungs 2 (two) times daily. (Patient not taking: Reported on 01/22/2017) 1 Inhaler 12   No current facility-administered medications on file prior to visit.      Past Medical History:  Diagnosis Date  . Asthma   . Frequent headaches    No Known Allergies  Social History   Socioeconomic History  . Marital status: Single  Spouse name: None  . Number of children: None  . Years of education: None  . Highest education level: None  Social Needs  . Financial resource strain: None  . Food insecurity - worry: None  . Food insecurity - inability: None  . Transportation needs - medical: None  . Transportation needs -  non-medical: None  Occupational History  . None  Tobacco Use  . Smoking status: Never Smoker  . Smokeless tobacco: Never Used  Substance and Sexual Activity  . Alcohol use: No  . Drug use: No  . Sexual activity: Not Currently  Other Topics Concern  . None  Social History Narrative  . None    Vitals:   02/20/17 0747  BP: 110/68  Pulse: 76  Resp: 12  Temp: 98.6 F (37 C)  SpO2: 100%   Body mass index is 23.1 kg/m.  Wt Readings from Last 3 Encounters:  02/20/17 175 lb 2 oz (79.4 kg)  01/22/17 171 lb 8 oz (77.8 kg)  12/29/16 171 lb 8 oz (77.8 kg)    Physical Exam  Nursing note and vitals reviewed. Constitutional: He is oriented to person, place, and time. He appears well-developed and well-nourished. No distress.  HENT:  Head: Normocephalic and atraumatic.  Mouth/Throat: Oropharynx is clear and moist and mucous membranes are normal.  Eyes: Conjunctivae and EOM are normal. Pupils are equal, round, and reactive to light.  Neck: No tracheal deviation present. No thyroid mass and no thyromegaly present.  Cardiovascular: Normal rate and regular rhythm.  No murmur heard. Pulses:      Dorsalis pedis pulses are 2+ on the right side, and 2+ on the left side.  Respiratory: Effort normal and breath sounds normal. No respiratory distress.  GI: Soft. He exhibits no mass. There is no hepatomegaly. There is no tenderness.  Musculoskeletal: He exhibits no edema or tenderness.       Cervical back: He exhibits normal range of motion, no tenderness and no bony tenderness.       Thoracic back: He exhibits no tenderness and no bony tenderness.       Lumbar back: He exhibits no tenderness and no bony tenderness.  Lymphadenopathy:    He has no cervical adenopathy.  Neurological: He is alert and oriented to person, place, and time. He has normal strength. No cranial nerve deficit. Coordination and gait normal.  Skin: Skin is warm. No rash noted. No erythema.  Psychiatric: His mood appears  anxious. Cognition and memory are normal.  Well groomed, good eye contact.    ASSESSMENT AND PLAN:   Orders Placed This Encounter  Procedures  . EKG 12-Lead    Myalgia Examination today normal. CK normal as well as rest of blood work. He is reporting mild improvement after starting Cymbalta.? Fibromyalgia. Stretching exercises may help.    Generalized anxiety disorder Improved. Still symptomatic,has several concerns today, most we have addressed in prior visits. No findings on exam or blood work that explain all his symptoms. Cymbalta increased from 30 mg to 60 mg. He will keep appt with psychiatrist today.   Fatigue Improved. We discussed possible etiologies. Workup has been negative so far. I am thinking anxiety is contributing to this problem. We will continue following.    Heart palpitations Possible etiologies discussed. History does not suggest a serious cardiac etiology. EKG done today otherwise normal, early repolarization variant.  No other EKG for comparison. Instructed about warning signs.  He will keep appt with immunologist on 03/01/17.  Elmond Poehlman G. Martinique, MD  [facility]. [place] office.

## 2017-02-20 ENCOUNTER — Encounter: Payer: Self-pay | Admitting: Family Medicine

## 2017-02-20 ENCOUNTER — Ambulatory Visit: Payer: BLUE CROSS/BLUE SHIELD | Admitting: Family Medicine

## 2017-02-20 VITALS — BP 110/68 | HR 76 | Temp 98.6°F | Resp 12 | Ht 73.0 in | Wt 175.1 lb

## 2017-02-20 DIAGNOSIS — M791 Myalgia, unspecified site: Secondary | ICD-10-CM | POA: Diagnosis not present

## 2017-02-20 DIAGNOSIS — R5383 Other fatigue: Secondary | ICD-10-CM | POA: Diagnosis not present

## 2017-02-20 DIAGNOSIS — F411 Generalized anxiety disorder: Secondary | ICD-10-CM

## 2017-02-20 DIAGNOSIS — Z23 Encounter for immunization: Secondary | ICD-10-CM | POA: Diagnosis not present

## 2017-02-20 DIAGNOSIS — R002 Palpitations: Secondary | ICD-10-CM

## 2017-02-20 MED ORDER — DULOXETINE HCL 60 MG PO CPEP: 60.0000 mg | ORAL_CAPSULE | Freq: Every day | ORAL | 0 refills | Status: DC

## 2017-02-20 NOTE — Assessment & Plan Note (Signed)
Improved. We discussed possible etiologies. Workup has been negative so far. I am thinking anxiety is contributing to this problem. We will continue following.

## 2017-02-20 NOTE — Assessment & Plan Note (Signed)
Examination today normal. CK normal as well as rest of blood work. He is reporting mild improvement after starting Cymbalta.? Fibromyalgia. Stretching exercises may help.

## 2017-02-20 NOTE — Assessment & Plan Note (Signed)
Improved. Still symptomatic,has several concerns today, most we have addressed in prior visits. No findings on exam or blood work that explain all his symptoms. Cymbalta increased from 30 mg to 60 mg. He will keep appt with psychiatrist today.

## 2017-02-20 NOTE — Assessment & Plan Note (Signed)
Possible etiologies discussed. History does not suggest a serious cardiac etiology. EKG done today otherwise normal, early repolarization variant.  No other EKG for comparison. Instructed about warning signs.

## 2017-02-20 NOTE — Patient Instructions (Addendum)
A few things to remember from today's visit:   Myalgia - Plan: DULoxetine (CYMBALTA) 60 MG capsule  Fatigue, unspecified type  Generalized anxiety disorder - Plan: DULoxetine (CYMBALTA) 60 MG capsule  Heart palpitations - Plan: EKG 12-Lead  Keep your appointment with your psychiatrist today. Keep appointment with allergy doctor in the next few days. Today Cymbalta was increased from 30 mg to 60 mg.  So far all the workup we have done have been negative. I will see you back in 3 months.  Please be sure medication list is accurate. If a new problem present, please set up appointment sooner than planned today.

## 2017-03-01 ENCOUNTER — Encounter: Payer: Self-pay | Admitting: Allergy

## 2017-03-01 ENCOUNTER — Ambulatory Visit: Payer: BLUE CROSS/BLUE SHIELD | Admitting: Allergy

## 2017-03-01 VITALS — BP 100/62 | HR 72 | Resp 18 | Ht 72.0 in | Wt 176.0 lb

## 2017-03-01 DIAGNOSIS — J309 Allergic rhinitis, unspecified: Secondary | ICD-10-CM

## 2017-03-01 DIAGNOSIS — J4541 Moderate persistent asthma with (acute) exacerbation: Secondary | ICD-10-CM

## 2017-03-01 DIAGNOSIS — T781XXA Other adverse food reactions, not elsewhere classified, initial encounter: Secondary | ICD-10-CM | POA: Diagnosis not present

## 2017-03-01 DIAGNOSIS — H1010 Acute atopic conjunctivitis, unspecified eye: Secondary | ICD-10-CM | POA: Diagnosis not present

## 2017-03-01 MED ORDER — LEVOCETIRIZINE DIHYDROCHLORIDE 5 MG PO TABS
5.0000 mg | ORAL_TABLET | Freq: Every evening | ORAL | 5 refills | Status: DC
Start: 2017-03-01 — End: 2017-06-29

## 2017-03-01 MED ORDER — OLOPATADINE HCL 0.7 % OP SOLN: 1.0000 [drp] | Freq: Every day | OPHTHALMIC | 5 refills | Status: DC | PRN

## 2017-03-01 MED ORDER — ALBUTEROL SULFATE HFA 108 (90 BASE) MCG/ACT IN AERS: INHALATION_SPRAY | RESPIRATORY_TRACT | 1 refills | Status: DC

## 2017-03-01 MED ORDER — EPINEPHRINE 0.3 MG/0.3ML IJ SOAJ: 0.3000 mg | Freq: Once | INTRAMUSCULAR | 1 refills | Status: AC

## 2017-03-01 MED ORDER — FLUTICASONE PROPIONATE HFA 110 MCG/ACT IN AERO: INHALATION_SPRAY | RESPIRATORY_TRACT | 5 refills | Status: DC

## 2017-03-01 NOTE — Progress Notes (Signed)
New Patient Note  RE: Jeffrey Hall MRN: [id number] DOB: [birth year] Date of Office Visit: 03/01/2017  Referring provider: [place], Betty G, MD Primary care provider: [place], Betty G, MD  Chief Complaint: breathing issues and allergies  History of present illness: Jeffrey Hall is a 22 y.o. male presenting today for consultation for breathing issues and allergies.  He is very concerned today about his health.  He is originally from [place] and [place].  He states he was applying for a job that required TB testing and it was positive.  Thus he had a CXR done last summer which was negative.  He states he received several injections for being positive on TB testing.   Furthermore he states that he has had symptoms of cough and SOB now for past 8 years or so.  He denies wheezing.  He reports SOB is worse at night and does report chest tightness. No nighttime awakenings.  He has Proventil tha the uses 2 puffs every 4 hours and some relief of symptoms as directed by his PCP.  He has never had a controller inhaler medication up to this point.  He reports he changed his diet and no longer eats fast food and is eating more fruits and vegetables.  He feels this dietary change has decreased the amount of cough he has.    He reports he has dry skin. He uses cocoa butter for moisturization. He denies having any itchy rashes.    He states he has allergies reporting symptoms of itchy, watery eyes, nasal congestion and sneezing.  Symptoms are year round.   He is on cetrizine daily for past 3 months but does not feel it helps.   With peanut, milk, egg, beans, sesame seed ingestions he reports he develops chest pain and trouble breathing and states he has eye pain as well.  He states symptoms start about 10 minutes after ingestion.  He first noted these symptoms about 2 months ago.  He has been avoiding all these food items since that time.     Review of  systems: Review of Systems  Constitutional: Negative for chills, fever and malaise/fatigue.  HENT: Positive for congestion. Negative for ear discharge, ear pain, nosebleeds, sinus pain and sore throat.   Eyes: Negative for pain, discharge and redness.  Respiratory: Positive for cough and shortness of breath. Negative for wheezing.   Cardiovascular: Negative for chest pain.  Gastrointestinal: Negative for abdominal pain, constipation, diarrhea, heartburn, nausea and vomiting.  Musculoskeletal: Negative for joint pain.  Skin: Negative for itching and rash.  Neurological: Negative for headaches.    All other systems negative unless noted above in HPI  Past medical history: Past Medical History:  Diagnosis Date  . Asthma   . Eczema   . Frequent headaches   . Urticaria     Past surgical history: Past Surgical History:  Procedure Laterality Date  . NO PAST SURGERIES      Family history:  Family History  Problem Relation Age of Onset  . Cancer Neg Hx   . Heart disease Neg Hx   . Hypertension Neg Hx   . Hyperlipidemia Neg Hx   . Depression Neg Hx     Social history: He lives in an apartment with electric heating and central cooling.  There is no concern for water damage, mildew or roaches in the home.  He works in [employer].  He denies any smoking or alcohol history.  Medication List: Allergies as of 03/01/2017   No Known Allergies     Medication List        Accurate as of 03/01/17  6:46 PM. Always use your most recent med list.          albuterol 108 (90 Base) MCG/ACT inhaler Commonly known as:  PROAIR HFA 2 puffs every 4-6 hours as needed for shortness of breath/cough/wheeze.   cetirizine 10 MG tablet Commonly known as:  ZYRTEC Take 10 mg by mouth daily.   DULoxetine 60 MG capsule Commonly known as:  CYMBALTA Take 1 capsule (60 mg total) by mouth daily.   EPINEPHrine 0.3 mg/0.3 mL Soaj injection Commonly known as:  EPI-PEN Inject 0.3 mLs (0.3 mg total)  into the muscle once for 1 dose.   fluticasone 110 MCG/ACT inhaler Commonly known as:  FLOVENT HFA 2 puffs 2 times daily with spacer   levocetirizine 5 MG tablet Commonly known as:  XYZAL Take 1 tablet (5 mg total) by mouth every evening.   Olopatadine HCl 0.7 % Soln Commonly known as:  PAZEO Place 1 drop into both eyes daily as needed.       Known medication allergies: No Known Allergies   Physical examination: Blood pressure 100/62, pulse 72, resp. rate 18, height 6' (1.829 m), weight 176 lb (79.8 kg), SpO2 98 %.  General: Alert, interactive, in no acute distress. HEENT: PERRLA, TMs pearly gray, turbinates minimally edematous without discharge, post-pharynx non erythematous. Neck: Supple without lymphadenopathy. Lungs: Clear to auscultation without wheezing, rhonchi or rales. {no increased work of breathing. CV: Normal S1, S2 without murmurs. Abdomen: Nondistended, nontender. Skin: Warm and dry, without lesions or rashes. Extremities:  No clubbing, cyanosis or edema. Neuro:   Grossly intact.  Diagnositics/Labs: Labs:  Component     Latest Ref Rng & Units 04/21/2016 11/23/2016 01/22/2017  WBC     4.0 - 10.5 K/uL 4.3    RBC     4.22 - 5.81 Mil/uL 5.36    Hemoglobin     13.0 - 17.0 g/dL 16.1    HCT     09.6 - 52.0 % 45.9    MCV     78.0 - 100.0 fl 85.7    MCHC     30.0 - 36.0 g/dL 04.5    RDW     40.9 - 15.5 % 13.6    Platelets     150.0 - 400.0 K/uL 234.0    Neutrophils     43.0 - 77.0 % 37.8 (L)    Lymphocytes     12.0 - 46.0 % 42.5    Monocytes Relative     3.0 - 12.0 % 11.0    Eosinophil     0.0 - 5.0 % 7.9 (H)    Basophil     0.0 - 3.0 % 0.8    NEUT#     1.4 - 7.7 K/uL 1.6    Lymphocyte #     0.7 - 4.0 K/uL 1.8    Monocyte #     0.1 - 1.0 K/uL 0.5    Eosinophils Absolute     0.0 - 0.7 K/uL 0.3    Basophils Absolute     0.0 - 0.1 K/uL 0.0    Sodium     135 - 145 mEq/L 138    Potassium     3.5 - 5.1 mEq/L 4.1    Chloride     96 - 112 mEq/L  103    CO2  19 - 32 mEq/L 30    Glucose     70 - 99 mg/dL 74    BUN     6 - 23 mg/dL 9    Creatinine     1.61 - 1.50 mg/dL 0.96    Total Bilirubin     0.2 - 1.2 mg/dL 0.7    Alkaline Phosphatase     39 - 117 U/L 83    AST     0 - 37 U/L 23    ALT     0 - 53 U/L 17    Total Protein     6.0 - 8.3 g/dL 7.8    Albumin     3.5 - 5.2 g/dL 4.8    Calcium     8.4 - 10.5 mg/dL 04.5    GFR     >40.98 mL/min 102.36    Sed Rate     0 - 15 mm/hr  2   CRP     0.5 - 20.0 mg/dL  <1.1 (L)   TSH     9.14 - 4.50 uIU/mL   2.80    Spirometry: FEV1: 1.97L  46%, FVC: 2.99L  60%, ratio consistent with obstructive with restrictive pattern.  this is pts first attempt at spirometry.   Post-BD he has a 34% increase in FEV1 to 2.63: 62% predicted which is a significant improvement however not normalized.   Allergy testing: deferred due to recent antihistamine use  Assessment and plan:   Asthma. Mod persistent   - symptoms of shortness of breath, cough and chest tightness that is worse as night and improved with albuterol use is consistent with asthma    - have access to albuterol inhaler 2 puffs every 4-6 hours as needed for cough/wheeze/shortness of breath/chest tightness.  May use 15-20 minutes prior to activity.   Monitor frequency of use.      - start Flovent 110 mcg 2 puffs twice a day with spacer  Asthma control goals:   Full participation in all desired activities (may need albuterol before activity)  Albuterol use two time or less a week on average (not counting use with activity)  Cough interfering with sleep two time or less a month  Oral steroids no more than once a year  No hospitalizations   Allergic rhinoconjunctivitis    - will obtain environmental allergy panel    - stop Cetirizine and trial Xyzal 5mg  daily    - for nasal congestion or drainage use OTC Flonase 1-2 sprays each nostril daily for 1-2 weeks at time before stopping once symptoms improve    - for  itchy/watery/red eyes use Pataday or Pazeo 1 drop each eye as needed daily   Adverse food reaction   - will obtain serum IgE levels for peanut, milk, egg, beans and sesame seed   - will prescribe an Epipen to have in case of severe reaction.  Follow food action plan in case of reaction.     - continue avoidance of these medications until labs return   Dry skin     - continue moisturization with emollients like Eucerin, Aquafor, CeraVe, Vaseline, Vanicream are all good options   Follow-up 4 months or sooner if needed  I appreciate the opportunity to take part in [facility] care. Please do not hesitate to contact me with questions.  Sincerely,   Margo Aye, MD Allergy/Immunology [facility]

## 2017-03-01 NOTE — Patient Instructions (Addendum)
Asthma   - symptoms of shortness of breath, cough and chest tightness that is worse as night and improved with albuterol use is consistent with asthma    - have access to albuterol inhaler 2 puffs every 4-6 hours as needed for cough/wheeze/shortness of breath/chest tightness.  May use 15-20 minutes prior to activity.   Monitor frequency of use.      - start Flovent 110 mcg 2 puffs twice a day with spacer  Asthma control goals:   Full participation in all desired activities (may need albuterol before activity)  Albuterol use two time or less a week on average (not counting use with activity)  Cough interfering with sleep two time or less a month  Oral steroids no more than once a year  No hospitalizations   Allergies    - will obtain environmental allergy panel    - stop Cetirizine and trial Xyzal 5mg  daily    - for nasal congestion or drainage use OTC Flonase 1-2 sprays each nostril daily for 1-2 weeks at time before stopping once symptoms improve    - for itchy/watery/red eyes use Pataday or Pazeo 1 drop each eye as needed daily   Adverse food reaction   - will obtain serum IgE levels for peanut, milk, egg, beans and sesame seed   - will prescribe an Epipen to have in case of severe reaction.  Follow food action plan in case of reaction.     - continue avoidance of these medications until labs return   Dry skin     - continue moisturization with emollients like Eucerin, Aquafor, CeraVe, Vaseline, Vanicream are all good options   Follow-up 4 months or sooner if needed

## 2017-03-04 LAB — ALLERGEN, PEANUT F13: Peanut IgE: <0.1 kU/L

## 2017-03-04 LAB — ALLERGENS W/TOTAL IGE AREA 2
Aspergillus Fumigatus IgE: <0.1 kU/L
Cedar, Mountain IgE: <0.1 kU/L
Common Silver Birch IgE: 0.16 kU/L — AB
D Pteronyssinus IgE: <0.1 kU/L
Dog Dander IgE: <0.1 kU/L
Elm, American IgE: <0.1 kU/L
IgE (Immunoglobulin E), Serum: 254 IU/mL — ABNORMAL HIGH (ref 0–100)
Mouse Urine IgE: <0.1 kU/L
Ragweed, Short IgE: <0.1 kU/L

## 2017-03-04 LAB — ALLERGEN, WHITE BEAN, F15: White Bean IgE: <0.1 kU/L

## 2017-03-04 LAB — ALLERGEN MILK

## 2017-03-04 LAB — ALLERGEN SESAME F10: Sesame Seed IgE: <0.1 kU/L

## 2017-03-04 LAB — F245-IGE EGG, WHOLE

## 2017-03-13 ENCOUNTER — Telehealth: Payer: Self-pay

## 2017-03-13 NOTE — Telephone Encounter (Signed)
Patient returned call regarding labs to [place] office. He has been informed and advised of results and recommendations that Dr. Delorse LekPadgett had. He has also scheduled the skin testing. I did let him know again that he would need to stop all antihistamines 3 days prior to that visit. He did acknowledge understanding.

## 2017-03-29 NOTE — Progress Notes (Signed)
Patient called back and was informed of all results and recommendations that the provider gave.

## 2017-04-05 ENCOUNTER — Encounter: Payer: Self-pay | Admitting: Allergy

## 2017-04-05 ENCOUNTER — Ambulatory Visit: Payer: BLUE CROSS/BLUE SHIELD | Admitting: Allergy

## 2017-04-05 VITALS — BP 106/68 | HR 74 | Temp 98.7°F | Resp 20

## 2017-04-05 DIAGNOSIS — T781XXD Other adverse food reactions, not elsewhere classified, subsequent encounter: Secondary | ICD-10-CM | POA: Diagnosis not present

## 2017-04-05 DIAGNOSIS — J309 Allergic rhinitis, unspecified: Secondary | ICD-10-CM | POA: Diagnosis not present

## 2017-04-05 DIAGNOSIS — J4540 Moderate persistent asthma, uncomplicated: Secondary | ICD-10-CM

## 2017-04-05 DIAGNOSIS — H1010 Acute atopic conjunctivitis, unspecified eye: Secondary | ICD-10-CM | POA: Diagnosis not present

## 2017-04-05 DIAGNOSIS — J4541 Moderate persistent asthma with (acute) exacerbation: Secondary | ICD-10-CM

## 2017-04-05 MED ORDER — FLUTICASONE PROPIONATE HFA 110 MCG/ACT IN AERO: INHALATION_SPRAY | RESPIRATORY_TRACT | 5 refills | Status: DC

## 2017-04-05 MED ORDER — MONTELUKAST SODIUM 10 MG PO TABS: 10.0000 mg | ORAL_TABLET | Freq: Every day | ORAL | 5 refills | Status: DC

## 2017-04-05 MED ORDER — FEXOFENADINE HCL 180 MG PO TABS: 180.0000 mg | ORAL_TABLET | Freq: Every day | ORAL | 5 refills | Status: DC

## 2017-04-05 NOTE — Progress Notes (Signed)
Follow-up Note  RE: Jeffrey BrakemanMaaroufou Sturdy MRN: [id number] DOB: April 28, 1995 Date of Office Visit: 04/05/2017   History of present illness: Jeffrey Hall is a 22 y.o. male presenting today for skin testing visit.  He was last seen in the office on 03/01/17 by myself for initial evaluation.  He does feel that his breathing is improved with use of Flovent 2 puffs twice a day.  He denies using albuterol for relief of symptoms.  He does use it prior to activity.  He denies any nighttime awakenings, no ED/UC visits or hospitalizations.   He feels the xyzal is working about the same as zyrtec did.  However he does note that when he stopped the xyzal over past 3 days he feels he has been more nauseous.  He feels he still has the same degree of itching as before and feel he is still having "skin changes."  He is using cocoa butter for moisturization.    I did obtain serum IgE levels at initial visit as unable to perform skin testing due to antihistamine use.  His food allergy IgE testing was negative to milk, peanut, sesame seed, bean and egg.  On environmental panel birch with low level sensitivity.   He presents today for skin testing to determine if he has any detectable sensitivity and complete testing.    Review of systems: Review of Systems  Constitutional: Negative for chills, fever and malaise/fatigue.  HENT: Negative for congestion, ear discharge, ear pain, nosebleeds, sinus pain and sore throat.   Eyes: Negative for pain, discharge and redness.  Respiratory: Negative for cough, shortness of breath and wheezing.   Cardiovascular: Negative for chest pain.  Gastrointestinal: Positive for constipation and nausea. Negative for abdominal pain, diarrhea, heartburn and vomiting.  Musculoskeletal: Negative for joint pain.  Skin: Positive for itching. Negative for rash.  Neurological: Negative for headaches.    All other systems negative unless noted above in HPI  Past  medical/social/surgical/family history have been reviewed and are unchanged unless specifically indicated below.  No changes  Medication List: Allergies as of 04/05/2017   No Known Allergies     Medication List        Accurate as of 04/05/17 12:21 PM. Always use your most recent med list.          albuterol 108 (90 Base) MCG/ACT inhaler Commonly known as:  PROAIR HFA 2 puffs every 4-6 hours as needed for shortness of breath/cough/wheeze.   cetirizine 10 MG tablet Commonly known as:  ZYRTEC Take 10 mg by mouth daily.   DULoxetine 60 MG capsule Commonly known as:  CYMBALTA Take 1 capsule (60 mg total) by mouth daily.   fluticasone 110 MCG/ACT inhaler Commonly known as:  FLOVENT HFA 2 puffs 2 times daily with spacer   levocetirizine 5 MG tablet Commonly known as:  XYZAL Take 1 tablet (5 mg total) by mouth every evening.   Olopatadine HCl 0.7 % Soln Commonly known as:  PAZEO Place 1 drop into both eyes daily as needed.       Known medication allergies: No Known Allergies   Physical examination: Blood pressure 106/68, pulse 74, temperature 98.7 F (37.1 C), temperature source Oral, resp. rate 20, SpO2 98 %.  General: Alert, interactive, in no acute distress. HEENT: PERRLA, TMs pearly gray, turbinates minimally edematous without discharge, post-pharynx non erythematous. Neck: Supple without lymphadenopathy. Lungs: Clear to auscultation without wheezing, rhonchi or rales. {no increased work of breathing. CV: Normal S1, S2 without murmurs. Abdomen: Nondistended,  nontender. Skin: dry skin. Extremities:  No clubbing, cyanosis or edema. Neuro:   Grossly intact.  Diagnositics/Labs: Labs:  Component     Latest Ref Rng & Units 03/01/2017  Peanut IgE     Class 0 kU/L <0.10  Milk IgE     Class 0 kU/L <0.10  Egg, Whole IgE     Class 0 kU/L <0.10  Sesame Seed IgE     Class 0 kU/L <0.10  White Bean IgE     Class 0 kU/L <0.10   Component     Latest Ref Rng &  Units 03/01/2017  IgE (Immunoglobulin E), Serum     0 - 100 IU/mL 254 (H)  D Pteronyssinus IgE     Class 0 kU/L <0.10  D Farinae IgE     Class 0 kU/L <0.10  Cat Dander IgE     Class 0 kU/L <0.10  Dog Dander IgE     Class 0 kU/L <0.10  [place] Grass IgE     Class 0 kU/L <0.10  Timothy Grass IgE     Class 0 kU/L <0.10  Johnson Grass IgE     Class 0 kU/L <0.10  Cockroach, German IgE     Class 0 kU/L <0.10  Penicillium Chrysogen IgE     Class 0 kU/L <0.10  Cladosporium Herbarum IgE     Class 0 kU/L <0.10  Aspergillus Fumigatus IgE     Class 0 kU/L <0.10  Alternaria Alternata IgE     Class 0 kU/L <0.10  Maple/Box Elder IgE     Class 0 kU/L <0.10  Common Silver Charletta Cousin IgE     Class 0/I kU/L 0.16 (A)  Cedar, Mountain IgE     Class 0 kU/L <0.10  Oak, White IgE     Class 0 kU/L <0.10  Elm, American IgE     Class 0 kU/L <0.10  Cottonwood IgE     Class 0 kU/L <0.10  Pecan, Hickory IgE     Class 0 kU/L <0.10  White Mulberry IgE     Class 0 kU/L <0.10  Ragweed, Short IgE     Class 0 kU/L <0.10  Pigweed, Rough IgE     Class 0 kU/L <0.10  Sheep Sorrel IgE Qn     Class 0 kU/L <0.10  Mouse Urine IgE     Class 0 kU/L <0.10   Spirometry: FEV1: 4.23L  100%, FVC: 5.34L  107%, ratio consistent with Nonobstructive pattern  Allergy testing: Environmental skin prick testing is positive to Aspergillus.  Intradermal skin prick testing is positive to wheat mix and dog.  10 food common allergies plus navy bean are negative to pricks. Allergy testing results were read and interpreted by provider, documented by clinical staff.   Assessment and plan: Moderate persistent asthma    - symptoms are improved    - have access to albuterol inhaler 2 puffs every 4-6 hours as needed for cough/wheeze/shortness of breath/chest tightness.  May use 15-20 minutes prior to activity.   Monitor frequency of use.      - continue Flovent 110 mcg 2 puffs twice a day with spacer - refilled today  Asthma  control goals:   Full participation in all desired activities (may need albuterol before activity)  Albuterol use two time or less a week on average (not counting use with activity)  Cough interfering with sleep two time or less a month  Oral steroids no more than once a year  No hospitalizations  Allergic rhinoconjunctivitis    - environmental panel showed low level sensitivity to birch tree pollen    - environmental skin prick testing today is positive to mold, weed mix and dog    - finish out your Levocetirizine.  Then will switch to Fexofenadine 180mg  daily.      - Avoidance measures provided.      - start Singulair 10mg  daily.  This should help with allergy symptoms and may help with itch    - for nasal congestion or drainage use OTC Flonase 1-2 sprays each nostril daily for 1-2 weeks at time before stopping once symptoms improve    - for itchy/watery/red eyes use Pataday or Pazeo 1 drop each eye as needed daily   Adverse food reaction   - serum IgE levels to foods were negative.  Skin prick testing today to 10 common food allergy plus beans are negative.  Thus you do not have IgE mediated food allergy at this time.     - you should have access to your epinephrine device.  Follow food action plan in case of severe reaction.    Dry skin     - continue moisturization with emollients like Eucerin, Aquafor, CeraVe, Vaseline, Vanicream are all good options   Follow-up 6-9 months or sooner if needed  I appreciate the opportunity to take part in [facility] care. Please do not hesitate to contact me with questions.  Sincerely,   Margo Aye, MD Allergy/Immunology [facility]

## 2017-04-05 NOTE — Patient Instructions (Addendum)
Asthma    - symptoms are improved    - have access to albuterol inhaler 2 puffs every 4-6 hours as needed for cough/wheeze/shortness of breath/chest tightness.  May use 15-20 minutes prior to activity.   Monitor frequency of use.      - continue Flovent 110 mcg 2 puffs twice a day with spacer - refilled today  Asthma control goals:   Full participation in all desired activities (may need albuterol before activity)  Albuterol use two time or less a week on average (not counting use with activity)  Cough interfering with sleep two time or less a month  Oral steroids no more than once a year  No hospitalizations   Allergies    - environmental panel showed low level sensitivity to birch tree pollen    - environmental skin prick testing today is positive to mold, weed mix and dog    - finish out your Levocetirizine.  Then will switch to Fexofenadine 180mg  daily.      - Avoidance measures provided.      - start Singulair 10mg  daily.  This should help with allergy symptoms and may help with itch    - for nasal congestion or drainage use OTC Flonase 1-2 sprays each nostril daily for 1-2 weeks at time before stopping once symptoms improve    - for itchy/watery/red eyes use Pataday or Pazeo 1 drop each eye as needed daily   Adverse food reaction   - serum IgE levels to foods were negative.  Skin prick testing today to 10 common food allergy plus beans are negative.  Thus you do not have IgE mediated food allergy at this time.     - you should have access to your epinephrine device.  Follow food action plan in case of severe reaction.    Dry skin     - continue moisturization with emollients like Eucerin, Aquafor, CeraVe, Vaseline, Vanicream are all good options   Follow-up 6-9 months or sooner if needed

## 2017-04-09 ENCOUNTER — Encounter: Payer: Self-pay | Admitting: Family Medicine

## 2017-04-16 ENCOUNTER — Encounter: Payer: Self-pay | Admitting: Family Medicine

## 2017-04-16 ENCOUNTER — Ambulatory Visit: Payer: BLUE CROSS/BLUE SHIELD | Admitting: Family Medicine

## 2017-04-16 VITALS — BP 120/80 | HR 60 | Temp 97.9°F | Ht 72.0 in | Wt 178.2 lb

## 2017-04-16 DIAGNOSIS — L290 Pruritus ani: Secondary | ICD-10-CM

## 2017-04-16 DIAGNOSIS — K5900 Constipation, unspecified: Secondary | ICD-10-CM

## 2017-04-16 DIAGNOSIS — J309 Allergic rhinitis, unspecified: Secondary | ICD-10-CM

## 2017-04-16 MED ORDER — HYDROCORTISONE 2.5 % RE CREA: 1.0000 "application " | TOPICAL_CREAM | Freq: Every day | RECTAL | 0 refills | Status: DC | PRN

## 2017-04-16 NOTE — Patient Instructions (Addendum)
A few things to remember from today's visit:   Constipation, unspecified constipation type  Allergic rhinitis, unspecified seasonality, unspecified trigger  Perianal pruritus - Plan: hydrocortisone (ANUSOL-HC) 2.5 % rectal cream Continue medications prescribed by allergist.   Metamucil may also help. Use wipes after bowel movements.    Constipation, Adult Constipation is when a person:  Poops (has a bowel movement) fewer times in a week than normal.  Has a hard time pooping.  Has poop that is dry, hard, or bigger than normal.  Follow these instructions at home: Eating and drinking   Eat foods that have a lot of fiber, such as: ? Fresh fruits and vegetables. ? Whole grains. ? Beans.  Eat less of foods that are high in fat, low in fiber, or overly processed, such as: ? [place] fries. ? Hamburgers. ? Cookies. ? Candy. ? Soda.  Drink enough fluid to keep your pee (urine) clear or pale yellow. General instructions  Exercise regularly or as told by your doctor.  Go to the restroom when you feel like you need to poop. Do not hold it in.  Take over-the-counter and prescription medicines only as told by your doctor. These include any fiber supplements.  Do pelvic floor retraining exercises, such as: ? Doing deep breathing while relaxing your lower belly (abdomen). ? Relaxing your pelvic floor while pooping.  Watch your condition for any changes.  Keep all follow-up visits as told by your doctor. This is important. Contact a doctor if:  You have pain that gets worse.  You have a fever.  You have not pooped for 4 days.  You throw up (vomit).  You are not hungry.  You lose weight.  You are bleeding from the anus.  You have thin, pencil-like poop (stool). Get help right away if:  You have a fever, and your symptoms suddenly get worse.  You leak poop or have blood in your poop.  Your belly feels hard or bigger than normal (is bloated).  You have very  bad belly pain.  You feel dizzy or you faint. This information is not intended to replace advice given to you by your health care provider. Make sure you discuss any questions you have with your health care provider. Document Released: 06/21/2007 Document Revised: 07/23/2015 Document Reviewed: 06/23/2015 Elsevier Interactive Patient Education  2018 [employer]  Please be sure medication list is accurate. If a new problem present, please set up appointment sooner than planned today.

## 2017-04-16 NOTE — Progress Notes (Signed)
ACUTE VISIT   HPI:  Chief Complaint  Patient presents with  . Pain/pressure in Anus    has nausea and constipation    [name] is a 22 y.o. male, who is here today complaining of anal pressure,itching/pain,and occasional nausea.  He has had these symptoms for 8+ years.  Hx of constipation, now having bowel movements daily. Alternates between hard/bulky and and soft stools. + Straining.  He has not noted blood in stool.  He thinks Cetirizine was helping with symptoms.He stopped taking it a few days ago because according to pt,he has no allergies.He followed with immunologist and states that allergic test was negative.  He also discontinued Singulair because he felt it was exacerbating problem. He has reported episodes of cough,dyapnea,and wheezing; all these attributed to asthma.  Periumbilical abdominal cramps that improve with defecation.  OTC Miralax did not help.  Milk seems to help with symptoms.  Review of Systems  Constitutional: Positive for fatigue (No more than usual). Negative for appetite change, fever and unexpected weight change.  HENT: Negative for mouth sores, sore throat and trouble swallowing.   Eyes: Negative for redness and visual disturbance.  Respiratory: Negative for cough, shortness of breath and wheezing.   Gastrointestinal: Positive for abdominal pain, constipation and nausea. Negative for blood in stool and vomiting.  Genitourinary: Negative for decreased urine volume, dysuria and hematuria.  Musculoskeletal: Negative for back pain and myalgias.  Skin: Negative for pallor and rash.  Allergic/Immunologic: Negative for food allergies.  Neurological: Negative for syncope, weakness and headaches.  Hematological: Negative for adenopathy. Does not bruise/bleed easily.  Psychiatric/Behavioral: Negative for confusion and hallucinations. The patient is nervous/anxious.       Current Outpatient Medications on File Prior to Visit    Medication Sig Dispense Refill  . DULoxetine (CYMBALTA) 60 MG capsule Take 1 capsule (60 mg total) by mouth daily. 90 capsule 0  . fluticasone (FLOVENT HFA) 110 MCG/ACT inhaler 2 puffs 2 times daily with spacer 1 Inhaler 5  . albuterol (PROAIR HFA) 108 (90 Base) MCG/ACT inhaler 2 puffs every 4-6 hours as needed for shortness of breath/cough/wheeze. (Patient not taking: Reported on 04/16/2017) 1 Inhaler 1  . cetirizine (ZYRTEC) 10 MG tablet Take 10 mg by mouth daily.    . fexofenadine (ALLEGRA) 180 MG tablet Take 1 tablet (180 mg total) by mouth daily. (Patient not taking: Reported on 04/16/2017) 30 tablet 5  . levocetirizine (XYZAL) 5 MG tablet Take 1 tablet (5 mg total) by mouth every evening. (Patient not taking: Reported on 04/16/2017) 30 tablet 5  . montelukast (SINGULAIR) 10 MG tablet Take 1 tablet (10 mg total) by mouth at bedtime. (Patient not taking: Reported on 04/16/2017) 30 tablet 5  . Olopatadine HCl (PAZEO) 0.7 % SOLN Place 1 drop into both eyes daily as needed. (Patient not taking: Reported on 04/05/2017) 1 Bottle 5   No current facility-administered medications on file prior to visit.      Past Medical History:  Diagnosis Date  . Asthma   . Eczema   . Frequent headaches   . Urticaria    No Known Allergies  Social History   Socioeconomic History  . Marital status: Single    Spouse name: Not on file  . Number of children: Not on file  . Years of education: Not on file  . Highest education level: Not on file  Occupational History  . Not on file  Social Needs  . Financial resource strain: Not on  file  . Food insecurity:    Worry: Not on file    Inability: Not on file  . Transportation needs:    Medical: Not on file    Non-medical: Not on file  Tobacco Use  . Smoking status: Never Smoker  . Smokeless tobacco: Never Used  Substance and Sexual Activity  . Alcohol use: No  . Drug use: No  . Sexual activity: Not Currently  Lifestyle  . Physical activity:    Days per  week: Not on file    Minutes per session: Not on file  . Stress: Not on file  Relationships  . Social connections:    Talks on phone: Not on file    Gets together: Not on file    Attends religious service: Not on file    Active member of club or organization: Not on file    Attends meetings of clubs or organizations: Not on file    Relationship status: Not on file  Other Topics Concern  . Not on file  Social History Narrative  . Not on file    Vitals:   04/16/17 0742  BP: 120/80  Pulse: 60  Temp: 97.9 F (36.6 C)  SpO2: 99%   Body mass index is 24.18 kg/m.   Physical Exam  Nursing note and vitals reviewed. Constitutional: He is oriented to person, place, and time. He appears well-developed and well-nourished. No distress.  HENT:  Head: Normocephalic and atraumatic.  Mouth/Throat: Oropharynx is clear and moist and mucous membranes are normal.  Eyes: Pupils are equal, round, and reactive to light. Conjunctivae are normal.  Cardiovascular: Normal rate and regular rhythm.  No murmur heard. Respiratory: Effort normal and breath sounds normal. No respiratory distress.  GI: Soft. He exhibits no mass. There is no hepatomegaly. There is no tenderness.  Genitourinary: Rectal exam shows no external hemorrhoid, no internal hemorrhoid, no fissure, no mass, no tenderness and anal tone normal.  Musculoskeletal: He exhibits no edema.  Lymphadenopathy:    He has no cervical adenopathy.  Neurological: He is alert and oriented to person, place, and time. He has normal strength. Gait normal.  Skin: Skin is warm. No rash noted. No erythema.  Psychiatric: His mood appears anxious.  Well groomed, good eye contact.    ASSESSMENT AND PLAN:  [name] was seen today for pain/pressure in anus.  Diagnoses and all orders for this visit:  Constipation, unspecified constipation type  It seems like he also has intermittent soft stools,so for now I am not recommending Linzess. ?  IBS. He can try OTC Metamucil. Adequate hydration and fiber intake.  Perianal pruritus  Examination today negative. ? Perianal dermatitis vs local irritation due to constipation/straining. Topical steroid, small amount daily as needed. Use wipes instead toilet paper. Adequate hygiene.  -     hydrocortisone (ANUSOL-HC) 2.5 % rectal cream; Place 1 application rectally daily as needed for hemorrhoids or anal itching. Small amount.  Dyschezia  Chronic. Possible etiologies discussed. ? External hemorrhoids,bulky/hard stools among some. Examination today negative for fissures and I do not appreciate hemorrhoids. May consider GI evaluation.  Allergic rhinitis, unspecified seasonality, unspecified trigger  Recommend continuing Cetirizine, which has helped with some eye pruritus,epiphora. I am not sure of Cetirizine is really helping with constipation though.  Continue following with immunologist.      Return in about 6 weeks (around 05/28/2017) for constipation..     -[name] was advised to seek immediate medical attention if sudden worsening symptoms.  Betty G. [place], MD  [facility]. [place] office.

## 2017-04-30 ENCOUNTER — Ambulatory Visit: Payer: BLUE CROSS/BLUE SHIELD | Admitting: Family Medicine

## 2017-04-30 ENCOUNTER — Encounter: Payer: Self-pay | Admitting: Gastroenterology

## 2017-04-30 ENCOUNTER — Encounter: Payer: Self-pay | Admitting: Family Medicine

## 2017-04-30 VITALS — BP 124/82 | HR 62 | Temp 98.1°F | Resp 12 | Ht 72.0 in | Wt 175.2 lb

## 2017-04-30 DIAGNOSIS — Z23 Encounter for immunization: Secondary | ICD-10-CM

## 2017-04-30 DIAGNOSIS — K5900 Constipation, unspecified: Secondary | ICD-10-CM | POA: Diagnosis not present

## 2017-04-30 DIAGNOSIS — R350 Frequency of micturition: Secondary | ICD-10-CM | POA: Diagnosis not present

## 2017-04-30 DIAGNOSIS — F411 Generalized anxiety disorder: Secondary | ICD-10-CM | POA: Diagnosis not present

## 2017-04-30 DIAGNOSIS — L290 Pruritus ani: Secondary | ICD-10-CM | POA: Diagnosis not present

## 2017-04-30 LAB — URINALYSIS, ROUTINE W REFLEX MICROSCOPIC
BILIRUBIN URINE: NEGATIVE
HGB URINE DIPSTICK: NEGATIVE
KETONES UR: NEGATIVE
LEUKOCYTES UA: NEGATIVE
Nitrite: NEGATIVE
RBC / HPF: NONE SEEN (ref 0–?)
Specific Gravity, Urine: <=1.005 — AB (ref 1.000–1.030)
Total Protein, Urine: NEGATIVE
UROBILINOGEN UA: 0.2 (ref 0.0–1.0)
Urine Glucose: NEGATIVE
pH: 7 (ref 5.0–8.0)

## 2017-04-30 LAB — COMPREHENSIVE METABOLIC PANEL
ALBUMIN: 4.3 g/dL (ref 3.5–5.2)
ALT: 18 U/L (ref 0–53)
AST: 21 U/L (ref 0–37)
Alkaline Phosphatase: 131 U/L — ABNORMAL HIGH (ref 39–117)
BUN: 9 mg/dL (ref 6–23)
CALCIUM: 9.4 mg/dL (ref 8.4–10.5)
CO2: 28 mEq/L (ref 19–32)
Chloride: 102 mEq/L (ref 96–112)
Creatinine, Ser: 0.92 mg/dL (ref 0.40–1.50)
GFR: 109.05 mL/min (ref >60.00)
Glucose, Bld: 76 mg/dL (ref 70–99)
POTASSIUM: 4.2 meq/L (ref 3.5–5.1)
SODIUM: 139 meq/L (ref 135–145)
Total Bilirubin: 0.4 mg/dL (ref 0.2–1.2)
Total Protein: 7.2 g/dL (ref 6.0–8.3)

## 2017-04-30 LAB — TSH: TSH: 3.69 u[IU]/mL (ref 0.35–4.50)

## 2017-04-30 NOTE — Assessment & Plan Note (Signed)
Educated about diagnosis and treatment options. Recommend increasing fluid and fiber intake, Benefiber OTC 2-3 times per day may help. He would like lab work today and he also agrees with GI evaluation. Explained that the probability of having "wounds" in GI tract causing constipation is very low. Lab work has been negative overall, I do not think antibiotic is needed at this time.  Further recommendations will be given according to lab results.

## 2017-04-30 NOTE — Assessment & Plan Note (Signed)
Hx of multiple complaints with no physical findings and negative work-up so far. I think some of his concerns could be related/aggravated by anxiety, fear of having a serious illness. Recommend continuing following with psychiatrist, he will benefit from psychotherapy.

## 2017-04-30 NOTE — Patient Instructions (Addendum)
A few things to remember from today's visit:   Constipation, unspecified constipation type - Plan: Comprehensive metabolic panel, TSH, Ambulatory referral to Gastroenterology  Perianal pruritus - Plan: Ambulatory referral to Gastroenterology  Frequency of urination - Plan: Urinalysis, Routine w reflex microscopic  About Constipation  Constipation Overview Constipation is the most common gastrointestinal complaint - about 4 million Americans experience constipation and make 2.5 million physician visits a year to get help for the problem.  Constipation can occur when the colon absorbs too much water, the colon's muscle contraction is slow or sluggish, and/or there is delayed transit time through the colon.  The result is stool that is hard and dry.  Indicators of constipation include straining during bowel movements greater than 25% of the time, having fewer than three bowel movements per week, and/or the feeling of incomplete evacuation.  There are established guidelines ([place] II ) for defining constipation. A person needs to have two or more of the following symptoms for at least 12 weeks (not necessarily consecutive) in the preceding 12 months: . Straining in  greater than 25% of bowel movements . Lumpy or hard stools in greater than 25% of bowel movements . Sensation of incomplete emptying in greater than 25% of bowel movements . Sensation of anorectal obstruction/blockade in greater than 25% of bowel movements . Manual maneuvers to help empty greater than 25% of bowel movements (e.g., digital evacuation, support of the pelvic floor)  . Less than  3 bowel movements/week . Loose stools are not present, and criteria for irritable bowel syndrome are insufficient  Common Causes of Constipation . Lack of fiber in your diet . Lack of physical activity . Medications, including iron and calcium supplements  . Dairy intake . Dehydration . Abuse of laxatives  Travel  Irritable Bowel  Syndrome  Pregnancy  Luteal phase of menstruation (after ovulation and before menses)  Colorectal problems  Intestinal Dysfunction  Treating Constipation  There are several ways of treating constipation, including changes to diet and exercise, use of laxatives, adjustments to the pelvic floor, and scheduled toileting.  These treatments include: . increasing fiber and fluids in the diet  . increasing physical activity . learning muscle coordination   learning proper toileting techniques and toileting modifications   designing and sticking  to a toileting schedule     2007, [employer] Doc.22 Please be sure medication list is accurate. If a new problem present, please set up appointment sooner than planned today.

## 2017-04-30 NOTE — Progress Notes (Signed)
HPI:  Chief Complaint  Patient presents with  . Hemorrhoids    [name] is a 22 y.o. male, who is here today complaining of persistent constipation and perianal pruritus. I saw him on 04/16/2017, when he was complaining about constipation, perianal pruritus, and dyschezia. OTC MiraLAX and Dulcolax were recommended.  According to patient, his psychiatrist recommended Vear Clock milk of magnesium OTC for constipation, which has helped.  He does not feel like it is safe to take this all the time in order to have a bowel movement. He is very concerned about the possibility of hemorrhoids as well as "wounds" in his "stomach" and rectum.  He has not noted diarrhea or blood in the stool. Today he reports no dyschezia.  He is certain about having GI tract "wounds", he is requesting antibiotic treatment.  + Nausea occasionally. He has not noted fever, chills, dysphagia, odynophagia, heartburn, melena, or vomiting.  States that he needs "medical treatment" he "cannot continue like this", he does "not feel good." He states that he has no pain but "I can feel the wounds."  Lab Results  Component Value Date   WBC 4.3 04/21/2016   HGB 15.6 04/21/2016   HCT 45.9 04/21/2016   MCV 85.7 04/21/2016   PLT 234.0 04/21/2016   Lab Results  Component Value Date   CRP <0.1 (L) 11/23/2016   Lab Results  Component Value Date   ESRSEDRATE 2 11/23/2016   Lab Results  Component Value Date   ALT 17 04/21/2016   AST 23 04/21/2016   ALKPHOS 83 04/21/2016   BILITOT 0.7 04/21/2016   Lab Results  Component Value Date   CREATININE 0.98 04/21/2016   BUN 9 04/21/2016   NA 138 04/21/2016   K 4.1 04/21/2016   CL 103 04/21/2016   CO2 30 04/21/2016   Lab Results  Component Value Date   WBC 4.3 04/21/2016   HGB 15.6 04/21/2016   HCT 45.9 04/21/2016   MCV 85.7 04/21/2016   PLT 234.0 04/21/2016   Concern about slow healing, states that when he injured it takes a long time for  skin lesions to heal.  Perianal pruritus , prescribed hydrocortisone 2.5% did not help.  He started using OTC medication she got back home , "Victago."  He is applying and eating this cream to treat symptoms, states that this cream helps to heal his wounds.  He is also reporting that "allergy medications" are helping to "calm down" all the symptoms.  Upon ROS he is also reporting urinary frequency when he drinks more fluids, he is also concerned about this. Negative for dysuria, gross hematuria,or decreased urine output.  -He is also requesting hepatitis B vaccine.  According to patient, he received the first dose last year but did not complete series.   Review of Systems  Constitutional: Positive for fatigue. Negative for appetite change, fever and unexpected weight change.  HENT: Negative for facial swelling, mouth sores, nosebleeds and trouble swallowing.   Eyes: Negative for redness and visual disturbance.  Respiratory: Negative for cough, shortness of breath and wheezing.   Cardiovascular: Negative for palpitations and leg swelling.  Gastrointestinal: Positive for constipation and nausea. Negative for abdominal pain, blood in stool and vomiting.  Endocrine: Negative for cold intolerance and heat intolerance.  Genitourinary: Positive for frequency. Negative for decreased urine volume, dysuria, genital sores, hematuria and urgency.  Musculoskeletal: Negative for back pain and myalgias.  Skin: Negative for pallor and rash.  Allergic/Immunologic: Positive for  environmental allergies.  Neurological: Negative for syncope, weakness and headaches.  Psychiatric/Behavioral: Negative for confusion. The patient is nervous/anxious.       Current Outpatient Medications on File Prior to Visit  Medication Sig Dispense Refill  . cetirizine (ZYRTEC) 10 MG tablet Take 10 mg by mouth daily.    . DULoxetine (CYMBALTA) 60 MG capsule Take 1 capsule (60 mg total) by mouth daily. 90 capsule 0  .  fluticasone (FLOVENT HFA) 110 MCG/ACT inhaler 2 puffs 2 times daily with spacer 1 Inhaler 5  . hydrocortisone (ANUSOL-HC) 2.5 % rectal cream Place 1 application rectally daily as needed for hemorrhoids or anal itching. Small amount. 30 g 0  . levocetirizine (XYZAL) 5 MG tablet Take 1 tablet (5 mg total) by mouth every evening. (Patient not taking: Reported on 04/16/2017) 30 tablet 5  . montelukast (SINGULAIR) 10 MG tablet Take 1 tablet (10 mg total) by mouth at bedtime. (Patient not taking: Reported on 04/16/2017) 30 tablet 5   No current facility-administered medications on file prior to visit.      Past Medical History:  Diagnosis Date  . Asthma   . Eczema   . Frequent headaches   . Urticaria    No Known Allergies  Social History   Socioeconomic History  . Marital status: Single    Spouse name: Not on file  . Number of children: Not on file  . Years of education: Not on file  . Highest education level: Not on file  Occupational History  . Not on file  Social Needs  . Financial resource strain: Not on file  . Food insecurity:    Worry: Not on file    Inability: Not on file  . Transportation needs:    Medical: Not on file    Non-medical: Not on file  Tobacco Use  . Smoking status: Never Smoker  . Smokeless tobacco: Never Used  Substance and Sexual Activity  . Alcohol use: No  . Drug use: No  . Sexual activity: Not Currently  Lifestyle  . Physical activity:    Days per week: Not on file    Minutes per session: Not on file  . Stress: Not on file  Relationships  . Social connections:    Talks on phone: Not on file    Gets together: Not on file    Attends religious service: Not on file    Active member of club or organization: Not on file    Attends meetings of clubs or organizations: Not on file    Relationship status: Not on file  Other Topics Concern  . Not on file  Social History Narrative  . Not on file    Vitals:   04/30/17 0657  BP: 124/82  Pulse: 62    Resp: 12  Temp: 98.1 F (36.7 C)  SpO2: 99%   Body mass index is 23.77 kg/m.   Wt Readings from Last 3 Encounters:  04/30/17 175 lb 4 oz (79.5 kg)  04/16/17 178 lb 4 oz (80.9 kg)  03/01/17 176 lb (79.8 kg)     Physical Exam  Nursing note and vitals reviewed. Constitutional: He is oriented to person, place, and time. He appears well-developed and well-nourished. No distress.  HENT:  Head: Normocephalic and atraumatic.  Mouth/Throat: Oropharynx is clear and moist and mucous membranes are normal.  Eyes: Pupils are equal, round, and reactive to light. Conjunctivae are normal.  Cardiovascular: Normal rate and regular rhythm.  No murmur heard. Respiratory: Effort normal and  breath sounds normal. No respiratory distress.  GI: Soft. Bowel sounds are normal. He exhibits no mass. There is no hepatosplenomegaly. There is no tenderness.  Genitourinary:  Genitourinary Comments: Rectal examination deferred to GI.  Musculoskeletal: He exhibits no edema or tenderness.  Lymphadenopathy:    He has no cervical adenopathy.       Right: No supraclavicular adenopathy present.       Left: No supraclavicular adenopathy present.  Neurological: He is alert and oriented to person, place, and time. He has normal strength. Gait normal.  Skin: Skin is warm. No rash noted. No erythema.  Psychiatric: His mood appears anxious.  Well-groomed, poor eye contact.      ASSESSMENT AND PLAN:   Mr. Lemar was seen today for hemorrhoids.  Orders Placed This Encounter  Procedures  . Hepatitis B vaccine adult IM  . Comprehensive metabolic panel  . Urinalysis, Routine w reflex microscopic  . TSH  . Ambulatory referral to Gastroenterology     Perianal pruritus  Last visit examination negative. Recommend continuing using Victago topically, recommend avoiding eating it. Adequate hygiene.  -     Ambulatory referral to Gastroenterology  Frequency of urination  Further recommendation will be  given according to UA. No other associated symptoms, reassured.  -     Urinalysis, Routine w reflex microscopic  Generalized anxiety disorder Hx of multiple complaints with no physical findings and negative work-up so far. I think some of his concerns could be related/aggravated by anxiety, fear of having a serious illness. Recommend continuing following with psychiatrist, he will benefit from psychotherapy.   Constipation Educated about diagnosis and treatment options. Recommend increasing fluid and fiber intake, Benefiber OTC 2-3 times per day may help. He would like lab work today and he also agrees with GI evaluation. Explained that the probability of having "wounds" in GI tract causing constipation is very low. Lab work has been negative overall, I do not think antibiotic is needed at this time.  Further recommendations will be given according to lab results.    Need for hepatitis B vaccination -     Hepatitis B vaccine adult IM    -Mr.Duey Snader was advised to seek immediate medical attention if sudden worsening symptoms.      Cariah Salatino G. [place], MD  [facility]. [place] office.

## 2017-05-06 ENCOUNTER — Encounter: Payer: Self-pay | Admitting: Family Medicine

## 2017-05-09 ENCOUNTER — Encounter: Payer: Self-pay | Admitting: Family Medicine

## 2017-05-17 ENCOUNTER — Other Ambulatory Visit: Payer: Self-pay | Admitting: Family Medicine

## 2017-05-17 DIAGNOSIS — F411 Generalized anxiety disorder: Secondary | ICD-10-CM

## 2017-05-17 DIAGNOSIS — M791 Myalgia, unspecified site: Secondary | ICD-10-CM

## 2017-05-22 ENCOUNTER — Other Ambulatory Visit: Payer: Self-pay | Admitting: Family Medicine

## 2017-05-22 DIAGNOSIS — F411 Generalized anxiety disorder: Secondary | ICD-10-CM

## 2017-05-22 DIAGNOSIS — M791 Myalgia, unspecified site: Secondary | ICD-10-CM

## 2017-06-13 ENCOUNTER — Ambulatory Visit: Payer: BLUE CROSS/BLUE SHIELD | Admitting: Gastroenterology

## 2017-06-28 ENCOUNTER — Ambulatory Visit: Payer: BLUE CROSS/BLUE SHIELD | Admitting: Allergy

## 2017-06-29 ENCOUNTER — Encounter: Payer: Self-pay | Admitting: Physician Assistant

## 2017-06-29 ENCOUNTER — Other Ambulatory Visit (INDEPENDENT_AMBULATORY_CARE_PROVIDER_SITE_OTHER): Payer: BLUE CROSS/BLUE SHIELD

## 2017-06-29 ENCOUNTER — Ambulatory Visit: Payer: BLUE CROSS/BLUE SHIELD | Admitting: Physician Assistant

## 2017-06-29 VITALS — BP 124/70 | HR 68 | Ht 73.0 in | Wt 177.0 lb

## 2017-06-29 DIAGNOSIS — K5900 Constipation, unspecified: Secondary | ICD-10-CM | POA: Diagnosis not present

## 2017-06-29 DIAGNOSIS — R109 Unspecified abdominal pain: Secondary | ICD-10-CM | POA: Diagnosis not present

## 2017-06-29 LAB — CBC WITH DIFFERENTIAL/PLATELET
BASOS ABS: 0.1 10*3/uL (ref 0.0–0.1)
Basophils Relative: 1.1 % (ref 0.0–3.0)
Eosinophils Absolute: 0.3 10*3/uL (ref 0.0–0.7)
Eosinophils Relative: 5.8 % — ABNORMAL HIGH (ref 0.0–5.0)
HCT: 45.9 % (ref 39.0–52.0)
Hemoglobin: 15.7 g/dL (ref 13.0–17.0)
LYMPHS PCT: 36 % (ref 12.0–46.0)
Lymphs Abs: 1.8 10*3/uL (ref 0.7–4.0)
MCHC: 34.2 g/dL (ref 30.0–36.0)
MCV: 88.7 fl (ref 78.0–100.0)
MONOS PCT: 8.5 % (ref 3.0–12.0)
Monocytes Absolute: 0.4 10*3/uL (ref 0.1–1.0)
Neutro Abs: 2.4 10*3/uL (ref 1.4–7.7)
Neutrophils Relative %: 48.6 % (ref 43.0–77.0)
Platelets: 213 10*3/uL (ref 150.0–400.0)
RBC: 5.17 Mil/uL (ref 4.22–5.81)
RDW: 12.7 % (ref 11.5–15.5)
WBC: 4.9 10*3/uL (ref 4.0–10.5)

## 2017-06-29 LAB — IGA: IGA: 169 mg/dL (ref 68–378)

## 2017-06-29 NOTE — Patient Instructions (Signed)
Your provider has requested that you go to the basement level for lab work before leaving today. Press "B" on the elevator. The lab is located at the first door on the left as you exit the elevator.  Start Benefiber daily in large glass of water.   Drink at least 60 oz of water daily.   Start IB Gard 2 tablets by mouth twice daily.   Exercise daily.   You have been scheduled for a CT scan of the abdomen and pelvis at [facility] ([address]---this is in the same building as Press [facility]).   You are scheduled on 07/12/17 at 8:30am. You should arrive 15 minutes prior to your appointment time for registration. Please follow the written instructions below on the day of your exam:  WARNING: IF YOU ARE ALLERGIC TO IODINE/X-RAY DYE, PLEASE NOTIFY RADIOLOGY IMMEDIATELY AT [phone]! YOU WILL BE GIVEN A 13 HOUR PREMEDICATION PREP.  1) Do not eat or drink anything after 4:30am (4 hours prior to your test) 2) You have been given 2 bottles of oral contrast to drink. The solution may taste better if refrigerated, but do NOT add ice or any other liquid to this solution. Shake well before drinking.    Drink 1 bottle of contrast @ 6:30am (2 hours prior to your exam)  Drink 1 bottle of contrast @ 7:30am (1 hour prior to your exam)  You may take any medications as prescribed with a small amount of water except for the following: Metformin, Glucophage, Glucovance, Avandamet, Riomet, Fortamet, Actoplus Met, Janumet, Glumetza or Metaglip. The above medications must be held the day of the exam AND 48 hours after the exam.  The purpose of you drinking the oral contrast is to aid in the visualization of your intestinal tract. The contrast solution may cause some diarrhea. Before your exam is started, you will be given a small amount of fluid to drink. Depending on your individual set of symptoms, you may also receive an intravenous injection of x-ray contrast/dye. Plan on being at [facility] for 30 minutes or longer, depending on the type of exam you are having performed.  This test typically takes 30-45 minutes to complete.  If you have any questions regarding your exam or if you need to reschedule, you may call the CT department at [phone] between the hours of 8:00 am and 5:00 pm, Monday-Friday.  ________________________________________________________________________

## 2017-06-29 NOTE — Progress Notes (Signed)
Subjective:    Patient ID: Jeffrey Hall, male    DOB: 1995-03-10, 22 y.o.   MRN: [id number]  HPI Jeffrey Hall is a 22 year old African male, new to GI today referred by Jeffrey Hall for complaints of constipation and abdominal discomfort. Patient has not had any prior GI evaluation.  He feels that his symptoms started about 4 years ago.  He has been in the [place] about 2 years.  He has been using milk of magnesia on an as-needed basis.  He says he usually has a bowel movement about every other day but in the past he had had at least 2 bowel movements daily.  He is changed his diet some stopped eating bread etc. but has not had any significant improvement in symptoms.  Says he does eat a lot of fiber and drinks a lot of water.  His appetite is been okay, weight has been stable no nausea or vomiting.  He describes some intermittent abdominal cramping. He describes a sensation of feeling itchy on the inside and is also concerned about having very dry itchy skin which she says is not usual for him.  He also states that he feels that he has inflammation in his body which is concerning him.  He says sometimes when he looks in the mirror he feels that he looks different when he is constipated. Recent labs are reviewed, April 2019, TSH was normal, C met normal except the exception of alk phos at 131 He had had some food allergy testing done earlier this year all of which was negative. Review of Systems Pertinent positive and negative review of systems were noted in the above HPI section.  All other review of systems was otherwise negative.  He was advised to have skin testing done. Patient also has history of generalized anxiety disorder and has been on Cymbalta over the past 7 months.  He does not feel that this is affected his constipation. Family history is negative for GI disease as far as he is aware.  Outpatient Encounter Medications as of 06/29/2017  Medication Sig  . cetirizine (ZYRTEC) 10 MG  tablet Take 10 mg by mouth daily.  . DULoxetine (CYMBALTA) 60 MG capsule Take 1 capsule (60 mg total) by mouth daily.  . fluticasone (FLOVENT HFA) 110 MCG/ACT inhaler 2 puffs 2 times daily with spacer  . hydrocortisone (ANUSOL-HC) 2.5 % rectal cream Place 1 application rectally daily as needed for hemorrhoids or anal itching. Small amount.  . magnesium hydroxide (MILK OF MAGNESIA) 400 MG/5ML suspension Take by mouth at bedtime.  . [DISCONTINUED] levocetirizine (XYZAL) 5 MG tablet Take 1 tablet (5 mg total) by mouth every evening. (Patient not taking: Reported on 04/16/2017)  . [DISCONTINUED] montelukast (SINGULAIR) 10 MG tablet Take 1 tablet (10 mg total) by mouth at bedtime. (Patient not taking: Reported on 04/16/2017)   No facility-administered encounter medications on file as of 06/29/2017.    No Known Allergies Patient Active Problem List   Diagnosis Date Noted  . Heart palpitations 02/20/2017  . Myalgia 01/22/2017  . Fatigue 01/22/2017  . Generalized anxiety disorder 12/29/2016  . Allergic rhinitis 11/21/2016  . Constipation 11/21/2016  . Multiple somatic complaints 11/21/2016  . Premature ejaculation 04/21/2016  . Asthma, persistent not controlled 04/21/2016   Social History   Socioeconomic History  . Marital status: Single    Spouse name: Not on file  . Number of children: Not on file  . Years of education: Not on file  . Highest education  level: Not on file  Occupational History  . Occupation: STUDENT     Comment: [employer]  Social Needs  . Financial resource strain: Not on file  . Food insecurity:    Worry: Not on file    Inability: Not on file  . Transportation needs:    Medical: Not on file    Non-medical: Not on file  Tobacco Use  . Smoking status: Never Smoker  . Smokeless tobacco: Never Used  Substance and Sexual Activity  . Alcohol use: No  . Drug use: No  . Sexual activity: Not Currently  Lifestyle  . Physical activity:    Days per week: Not on file     Minutes per session: Not on file  . Stress: Not on file  Relationships  . Social connections:    Talks on phone: Not on file    Gets together: Not on file    Attends religious service: Not on file    Active member of club or organization: Not on file    Attends meetings of clubs or organizations: Not on file    Relationship status: Not on file  . Intimate partner violence:    Fear of current or ex partner: Not on file    Emotionally abused: Not on file    Physically abused: Not on file    Forced sexual activity: Not on file  Other Topics Concern  . Not on file  Social History Narrative  . Not on file    Jeffrey Hall family history is not on file.      Objective:    Vitals:   06/29/17 0831  BP: 124/70  Pulse: 68    Physical Exam; well-developed young African male in no acute distress, pleasant, speaks [place] well.  Blood pressure 124/70 pulse 68, height 6 foot 1, weight 177, BMI 23.3.  HEENT ;nontraumatic normocephalic EOMI PERRLA sclera anicteric, oropharynx benign, Cardiovascular; regular rate and rhythm with S1-S2 no murmur rub or gallop, Pulmonary ;clear bilaterally, Abdomen; soft, there is no focal tenderness, no guarding or rebound no palpable mass or hepatosplenomegaly bowel sounds are present, Rectal exam not done, Extremities ;no clubbing cyanosis or edema skin warm dry, Neuro psych ;alert and oriented, grossly nonfocal mood and affect appropriate       Assessment & Plan:   #28 22 year old African male, living in the [place] over the past 2 years with complaints of constipation, intermittent abdominal discomfort and cramping, and complaining of feeling "inflamed" internally. Etiology of symptoms is not clear, rule out IBS/functional mild constipation. Rule out IBD Rule out celiac disease  #2 anxiety disorder  Plan; CBC with differential, TTG/IgA Schedule for abdominal imaging with CT abdomen and pelvis Continue high-fiber diet, advised to drink at least 60  ounces of water daily, add Benefiber 1 scoop daily and a glass of water Trial of IBgard 2 p.o. twice daily We will follow-up in the office in 3 to 4 weeks. Patient will be established with Dr. Ardis Hughs.  Jeffrey Hall Jeffrey Harold PA-C 06/29/2017   Cc: Hall, Betty G, MD

## 2017-07-02 LAB — TISSUE TRANSGLUTAMINASE, IGA: (TTG) AB, IGA: 1 U/mL

## 2017-07-02 NOTE — Progress Notes (Signed)
I agree with the above note, plan 

## 2017-07-12 ENCOUNTER — Inpatient Hospital Stay: Admission: RE | Admit: 2017-07-12 | Payer: BLUE CROSS/BLUE SHIELD | Source: Ambulatory Visit

## 2017-07-13 ENCOUNTER — Ambulatory Visit (INDEPENDENT_AMBULATORY_CARE_PROVIDER_SITE_OTHER)
Admission: RE | Admit: 2017-07-13 | Discharge: 2017-07-13 | Disposition: A | Discharge status: Home / Self Care | Payer: BLUE CROSS/BLUE SHIELD | Source: Ambulatory Visit | Attending: Physician Assistant | Admitting: Physician Assistant

## 2017-07-13 DIAGNOSIS — R109 Unspecified abdominal pain: Secondary | ICD-10-CM | POA: Diagnosis not present

## 2017-07-13 DIAGNOSIS — K5900 Constipation, unspecified: Secondary | ICD-10-CM

## 2017-07-13 MED ORDER — IOPAMIDOL (ISOVUE-300) INJECTION 61%
100.0000 mL | Freq: Once | INTRAVENOUS | Status: AC | PRN
  Administered 2017-07-13: 100 mL via INTRAVENOUS

## 2017-07-27 ENCOUNTER — Ambulatory Visit: Payer: BLUE CROSS/BLUE SHIELD | Admitting: Family Medicine

## 2017-07-27 ENCOUNTER — Encounter: Payer: Self-pay | Admitting: Family Medicine

## 2017-07-27 VITALS — BP 118/70 | HR 68 | Temp 97.9°F | Resp 12 | Ht 73.0 in | Wt 179.4 lb

## 2017-07-27 DIAGNOSIS — R768 Other specified abnormal immunological findings in serum: Secondary | ICD-10-CM | POA: Diagnosis not present

## 2017-07-27 DIAGNOSIS — R7689 Other specified abnormal immunological findings in serum: Secondary | ICD-10-CM

## 2017-07-27 DIAGNOSIS — K5900 Constipation, unspecified: Secondary | ICD-10-CM

## 2017-07-27 DIAGNOSIS — R748 Abnormal levels of other serum enzymes: Secondary | ICD-10-CM | POA: Diagnosis not present

## 2017-07-27 DIAGNOSIS — Z1159 Encounter for screening for other viral diseases: Secondary | ICD-10-CM | POA: Diagnosis not present

## 2017-07-27 DIAGNOSIS — R7612 Nonspecific reaction to cell mediated immunity measurement of gamma interferon antigen response without active tuberculosis: Secondary | ICD-10-CM | POA: Diagnosis not present

## 2017-07-27 DIAGNOSIS — Z111 Encounter for screening for respiratory tuberculosis: Secondary | ICD-10-CM | POA: Diagnosis not present

## 2017-07-27 LAB — HEPATIC FUNCTION PANEL
ALBUMIN: 4.3 g/dL (ref 3.5–5.2)
ALK PHOS: 103 U/L (ref 39–117)
ALT: 22 U/L (ref 0–53)
AST: 24 U/L (ref 0–37)
Bilirubin, Direct: 0.1 mg/dL (ref 0.0–0.3)
TOTAL PROTEIN: 7.1 g/dL (ref 6.0–8.3)
Total Bilirubin: 0.5 mg/dL (ref 0.2–1.2)

## 2017-07-27 LAB — GAMMA GT: GGT: 29 U/L (ref 7–51)

## 2017-07-27 NOTE — Progress Notes (Signed)
ACUTE VISIT   HPI:  Chief Complaint  Patient presents with  . Follow-up    discuss CT results, wants repeat labwork for Hep B    Jeffrey Hall is a 22 y.o. male, who is here today requesting to have some labs done.\ and to go through recent abdominal CT. Abdominal CT was ordered by GI due to chronic abdominal pain and nausea. 07/13/17: Borderline appearance for constipation. Otherwise, a cause for the patient's chronic abdominal pain is not identified. He would like to have hep B and TB screening done.  Adrenals/Urinary Tract: 5 mm hypodense lesion in the right mid kidney on image 24/2, statistically likely to be a small cyst but technically nonspecific due to small size. Adrenal glands normal. Unremarkable  He denies any recent exposure.  Alkaline phosphatase mildly elevated at 131 3 months ago.  Hx of constipation, symptoms have improved with fiber and adequate fluid intake.GI appt 08/13/17. He is not c/o abdominal pain. Daily bowel movements, about 2 daily. He has not noted blood in stool.  No jaundice.  Negative for alcohol abuse,illicit drug use,or other risk factor. He is not sure about vaccination or TB screening when he came to [place].    Anxiety, follows with psych. Psychiatrist appt 08/10/17.  Allergic rhinitis: Next Immunologist appt 09/2017.   Review of Systems  Constitutional: Positive for fatigue. Negative for activity change, appetite change, fever and unexpected weight change.  HENT: Negative for nosebleeds, sore throat and trouble swallowing.   Eyes: Negative for redness and visual disturbance.  Respiratory: Negative for apnea, cough, shortness of breath and wheezing.   Cardiovascular: Negative for chest pain, palpitations and leg swelling.  Gastrointestinal: Negative for abdominal pain, nausea and vomiting.  Genitourinary: Negative for decreased urine volume, dysuria and hematuria.  Neurological: Negative for dizziness, speech  difficulty, weakness and headaches.  Hematological: Negative for adenopathy. Does not bruise/bleed easily.  Psychiatric/Behavioral: Negative for confusion. The patient is nervous/anxious.       Current Outpatient Medications on File Prior to Visit  Medication Sig Dispense Refill  . DULoxetine (CYMBALTA) 60 MG capsule Take 1 capsule (60 mg total) by mouth daily. 90 capsule 0  . fluticasone (FLOVENT HFA) 110 MCG/ACT inhaler 2 puffs 2 times daily with spacer 1 Inhaler 5  . levocetirizine (XYZAL) 5 MG tablet TAKE 1 TABLET BY MOUTH ONCE DAILY EVERY EVENING  5  . magnesium hydroxide (MILK OF MAGNESIA) 400 MG/5ML suspension Take by mouth as needed.     . hydrocortisone (ANUSOL-HC) 2.5 % rectal cream Place 1 application rectally daily as needed for hemorrhoids or anal itching. Small amount. (Patient not taking: Reported on 07/27/2017) 30 g 0   No current facility-administered medications on file prior to visit.      Past Medical History:  Diagnosis Date  . Asthma   . Eczema   . Frequent headaches   . Urticaria    No Known Allergies  Social History   Socioeconomic History  . Marital status: Single    Spouse name: Not on file  . Number of children: Not on file  . Years of education: Not on file  . Highest education level: Not on file  Occupational History  . Occupation: STUDENT     Comment: [employer]  Social Needs  . Financial resource strain: Not on file  . Food insecurity:    Worry: Not on file    Inability: Not on file  . Transportation needs:    Medical: Not on file  Non-medical: Not on file  Tobacco Use  . Smoking status: Never Smoker  . Smokeless tobacco: Never Used  Substance and Sexual Activity  . Alcohol use: No  . Drug use: No  . Sexual activity: Not Currently  Lifestyle  . Physical activity:    Days per week: Not on file    Minutes per session: Not on file  . Stress: Not on file  Relationships  . Social connections:    Talks on phone: Not on file    Gets  together: Not on file    Attends religious service: Not on file    Active member of club or organization: Not on file    Attends meetings of clubs or organizations: Not on file    Relationship status: Not on file  Other Topics Concern  . Not on file  Social History Narrative  . Not on file    Vitals:   07/27/17 1036  BP: 118/70  Pulse: 68  Resp: 12  Temp: 97.9 F (36.6 C)  SpO2: 97%   Body mass index is 23.67 kg/m.   Wt Readings from Last 3 Encounters:  07/27/17 179 lb 6 oz (81.4 kg)  06/29/17 177 lb (80.3 kg)  04/30/17 175 lb 4 oz (79.5 kg)      Physical Exam  Nursing note and vitals reviewed. Constitutional: He is oriented to person, place, and time. He appears well-developed. No distress.  HENT:  Head: Normocephalic and atraumatic.  Mouth/Throat: Oropharynx is clear and moist and mucous membranes are normal.  Eyes: Pupils are equal, round, and reactive to light. Conjunctivae are normal.  Cardiovascular: Normal rate and regular rhythm.  No murmur heard. Pulses:      Dorsalis pedis pulses are 2+ on the right side, and 2+ on the left side.  Respiratory: Effort normal and breath sounds normal. No respiratory distress.  GI: Soft. He exhibits no mass. There is no hepatomegaly. There is no tenderness.  Musculoskeletal: He exhibits no edema or tenderness.  Lymphadenopathy:    He has no cervical adenopathy.  Neurological: He is alert and oriented to person, place, and time. He has normal strength.  Skin: Skin is warm. No rash noted. No erythema.  Psychiatric: His mood appears anxious. Cognition and memory are normal.  Well groomed, good eye contact.     ASSESSMENT AND PLAN:  Jeffrey Hall was seen today for follow-up.  Diagnoses and all orders for this visit:  Lab Results  Component Value Date   ALT 22 07/27/2017   AST 24 07/27/2017   ALKPHOS 103 07/27/2017   BILITOT 0.5 07/27/2017    Elevated alkaline phosphatase level  Mild. We discussed possible  etiologies. Hepatitis work-up ordered. We will repeat labs and give recommendations accordingly.   -     Hepatitis B surface antibody -     Hepatitis B surface antigen -     Hepatitis C antibody -     Hepatic function panel -     Gamma GT  Encounter for HCV screening test for low risk patient -     Hepatitis C antibody  Screening-pulmonary TB -     QuantiFERON-TB Gold Plus  Constipation, unspecified constipation type  Continue current management. Adequate fiber and fluid intake. Keep next appt with GI.  25 min face to face OV. > 50% was dedicated to reviewing abdominal CT findings, no suspicious lesions. We discussed differential Dx, prognosis, and coordination of care.  He agrees with annual visits, before if needed. He will  continue following with psych,immunologist,and GI.     Return in about 1 year (around 07/28/2018) for routine.      Betty G. [place], MD  [facility]. [place] office.

## 2017-07-27 NOTE — Patient Instructions (Addendum)
A few things to remember from today's visit:   Elevated alkaline phosphatase level - Plan: Hepatitis B surface antibody, Hepatitis B surface antigen, Hepatitis C antibody, Hepatic function panel, Gamma GT  Encounter for HCV screening test for low risk patient - Plan: Hepatitis C antibody  Screening-pulmonary TB - Plan: QuantiFERON-TB Gold Plus  Constipation, unspecified constipation type  Continue following with gastroenterologist ,allergy doctor,and psychiatrist.    Please be sure medication list is accurate. If a new problem present, please set up appointment sooner than planned today.

## 2017-07-29 ENCOUNTER — Encounter: Payer: Self-pay | Admitting: Family Medicine

## 2017-07-30 ENCOUNTER — Other Ambulatory Visit: Payer: Self-pay

## 2017-07-30 ENCOUNTER — Other Ambulatory Visit (INDEPENDENT_AMBULATORY_CARE_PROVIDER_SITE_OTHER): Payer: BLUE CROSS/BLUE SHIELD

## 2017-07-30 ENCOUNTER — Ambulatory Visit: Payer: BLUE CROSS/BLUE SHIELD | Admitting: *Deleted

## 2017-07-30 ENCOUNTER — Encounter: Payer: Self-pay | Admitting: Family Medicine

## 2017-07-30 ENCOUNTER — Ambulatory Visit (INDEPENDENT_AMBULATORY_CARE_PROVIDER_SITE_OTHER): Payer: BLUE CROSS/BLUE SHIELD

## 2017-07-30 DIAGNOSIS — R768 Other specified abnormal immunological findings in serum: Secondary | ICD-10-CM | POA: Diagnosis not present

## 2017-07-30 DIAGNOSIS — R7612 Nonspecific reaction to cell mediated immunity measurement of gamma interferon antigen response without active tuberculosis: Secondary | ICD-10-CM

## 2017-07-30 NOTE — Progress Notes (Signed)
Tuberculin skin test applied to Right ventral forearm By Rossie MuskratNana Kwei.  Pt aware to return to office to have PPD read within 48-72 hours.   Nadara EatonAshtyn Harvel Meskill, CMA

## 2017-08-01 ENCOUNTER — Ambulatory Visit: Payer: BLUE CROSS/BLUE SHIELD | Admitting: Physician Assistant

## 2017-08-01 LAB — HEPATITIS B SURFACE ANTIGEN: Hepatitis B Surface Ag: NONREACTIVE

## 2017-08-01 LAB — HEPATITIS C ANTIBODY
Hepatitis C Ab: REACTIVE — AB
SIGNAL TO CUT-OFF: 2.95 — ABNORMAL HIGH (ref <1.00)

## 2017-08-01 LAB — QUANTIFERON-TB GOLD PLUS
Mitogen-NIL: >10 [IU]/mL
NIL: 0.19 [IU]/mL
QuantiFERON-TB Gold Plus: POSITIVE — AB
TB1-NIL: 1.32 [IU]/mL
TB2-NIL: 1.14 [IU]/mL

## 2017-08-01 LAB — TB SKIN TEST
Induration: 7 mm
TB Skin Test: POSITIVE

## 2017-08-01 LAB — HEPATITIS C RNA QUANTITATIVE
HCV QUANT LOG: NOT DETECTED {Log_IU}/mL
HCV RNA, PCR, QN: NOT DETECTED [IU]/mL

## 2017-08-01 LAB — HCV RNA,QUANTITATIVE REAL TIME PCR
HCV Quantitative Log: <1.18 Log IU/mL
HCV RNA, PCR, QN: NOT DETECTED [IU]/mL

## 2017-08-01 LAB — HEPATITIS B SURFACE ANTIBODY,QUALITATIVE: Hep B S Ab: REACTIVE — AB

## 2017-08-01 NOTE — Progress Notes (Signed)
PPD read by myself and Caryn Sectionarolyn Oakley, RN - confirmed positive PPD at 7mm induration. Dr SwazilandJordan notified of positive PPD  Will notify [facility] Department of results.   Nadara EatonAshtyn Green, CMA

## 2017-08-03 ENCOUNTER — Encounter: Payer: Self-pay | Admitting: Physician Assistant

## 2017-08-03 ENCOUNTER — Ambulatory Visit: Payer: BLUE CROSS/BLUE SHIELD | Admitting: Physician Assistant

## 2017-08-03 ENCOUNTER — Other Ambulatory Visit: Payer: Self-pay

## 2017-08-03 ENCOUNTER — Telehealth: Payer: Self-pay | Admitting: Allergy

## 2017-08-03 VITALS — BP 110/70 | HR 73 | Ht 73.0 in | Wt 179.4 lb

## 2017-08-03 DIAGNOSIS — K5904 Chronic idiopathic constipation: Secondary | ICD-10-CM | POA: Diagnosis not present

## 2017-08-03 MED ORDER — MONTELUKAST SODIUM 10 MG PO TABS: 10.0000 mg | ORAL_TABLET | Freq: Every day | ORAL | 2 refills | Status: DC

## 2017-08-03 MED ORDER — MONTELUKAST SODIUM 10 MG PO TABS: 10.0000 mg | ORAL_TABLET | Freq: Every day | ORAL | 5 refills | Status: DC

## 2017-08-03 NOTE — Telephone Encounter (Signed)
Sent in rx.

## 2017-08-03 NOTE — Patient Instructions (Addendum)
If you are age 23 or younger, your body mass index should be between 19-25. Your Body mass index is 23.67 kg/m. If this is out of the aformentioned range listed, please consider follow up with your Primary Care Provider.   Continue Benefiber daily. Drink lots of water.  You can get Natures Bounty or Natures Place gummy vitamins with probiotic daily. Any multivitamin with probiotic.   Follow up as needed.

## 2017-08-03 NOTE — Telephone Encounter (Signed)
Called and spoke with patient due to what pharmacy he wanted us to send Rx to and he stated [facility] [address], Rx was sent there.

## 2017-08-03 NOTE — Progress Notes (Signed)
Subjective:    Patient ID: Jeffrey Hall, male    DOB: 1995/04/07, 22 y.o.   MRN: [id number]  HPI Jeffrey Hall is a 22 year old African male who has been living in the [place] over the past 2 years.  He was initially seen in consultation here on 06/29/2017 with multiple GI complaints, as well as other vague complaints like feeling "inflamed" internally, having itchy skin.  He was primarily complaining of constipation and some intermittent abdominal cramping.  His constipation was not severe he was just concerned because the used to have 2 bowel movements every day and pattern had changed to having 1 bowel movement every other day. He has had fairly extensive evaluation recently.  CT of the abdomen and pelvis was negative.  He was started on a trial of IBgard twice daily which he says was not helpful.  He was started on Benefiber daily and a high-fiber diet and says that that has been working well.  His bowel movements are much more regular.  He has no specific complaints of abdominal discomfort today.  He says he feels pretty good, appetite has been good etc. Reviewing his chart, he also had hepatitis serologies done by primary care, hepatitis B surface antigen is negative hepatitis B surface antibody positive, hepatitis C antibody was positive but hep C RNA quant is negative.  Liver tests have been normal. He also had TB skin testing done which was positive and QuantiFERON gold is also positive. Chest x-ray PA and lateral was negative.  Patient tells me he is scheduled to see infectious disease.    Review of Systems Pertinent positive and negative review of systems were noted in the above HPI section.  All other review of systems was otherwise negative.  Outpatient Encounter Medications as of 08/03/2017  Medication Sig  . DULoxetine (CYMBALTA) 60 MG capsule Take 1 capsule (60 mg total) by mouth daily.  . fluticasone (FLOVENT HFA) 110 MCG/ACT inhaler 2 puffs 2 times daily with spacer  .  levocetirizine (XYZAL) 5 MG tablet TAKE 1 TABLET BY MOUTH ONCE DAILY EVERY EVENING  . magnesium hydroxide (MILK OF MAGNESIA) 400 MG/5ML suspension Take by mouth as needed.   . Wheat Dextrin (BENEFIBER) POWD Take by mouth.  . [DISCONTINUED] hydrocortisone (ANUSOL-HC) 2.5 % rectal cream Place 1 application rectally daily as needed for hemorrhoids or anal itching. Small amount. (Patient not taking: Reported on 08/03/2017)  . [DISCONTINUED] Wheat Dextrin (BENEFIBER) POWD Take by mouth.   No facility-administered encounter medications on file as of 08/03/2017.    No Known Allergies Patient Active Problem List   Diagnosis Date Noted  . Heart palpitations 02/20/2017  . Myalgia 01/22/2017  . Fatigue 01/22/2017  . Generalized anxiety disorder 12/29/2016  . Allergic rhinitis 11/21/2016  . Constipation 11/21/2016  . Multiple somatic complaints 11/21/2016  . Premature ejaculation 04/21/2016  . Asthma, persistent not controlled 04/21/2016   Social History   Socioeconomic History  . Marital status: Single    Spouse name: Not on file  . Number of children: Not on file  . Years of education: Not on file  . Highest education level: Not on file  Occupational History  . Occupation: STUDENT     Comment: [employer]  Social Needs  . Financial resource strain: Not on file  . Food insecurity:    Worry: Not on file    Inability: Not on file  . Transportation needs:    Medical: Not on file    Non-medical: Not on file  Tobacco Use  .  Smoking status: Never Smoker  . Smokeless tobacco: Never Used  Substance and Sexual Activity  . Alcohol use: No  . Drug use: No  . Sexual activity: Not Currently  Lifestyle  . Physical activity:    Days per week: Not on file    Minutes per session: Not on file  . Stress: Not on file  Relationships  . Social connections:    Talks on phone: Not on file    Gets together: Not on file    Attends religious service: Not on file    Active member of club or organization:  Not on file    Attends meetings of clubs or organizations: Not on file    Relationship status: Not on file  . Intimate partner violence:    Fear of current or ex partner: Not on file    Emotionally abused: Not on file    Physically abused: Not on file    Forced sexual activity: Not on file  Other Topics Concern  . Not on file  Social History Narrative  . Not on file    [name]'s family history is not on file.      Objective:    Vitals:   08/03/17 0838  BP: 110/70  Pulse: 73    Physical Exam; well-developed young African male in no acute distress, pleasant blood pressure 110/70 pulse 73, height 6 foot 1, weight 179, BMI 23.6.  HEENT; nontraumatic normocephalic EOMI PERRLA sclera anicteric.  Neuro psych; mood and affect appropriate, alert and oriented grossly nonfocal       Assessment & Plan:   #241 22 year old African male with functional constipation-recent imaging with CT scan unremarkable.  Symptoms have improved with addition of Benefiber daily, high-fiber diet and liberal water intake.  #2 recent positive TB skin test and QuantiFERON gold-patient reports he has been referred to infectious disease by his PCP. #3.  Chronic anxiety  Plan; Patient will continue Benefiber on a daily basis, high-fiber diet and liberal water intake. He asks about a vitamin.  Recommended a multivitamin/probiotic combination gummy such as Natures place. Patient will follow-up with GI on an as-needed basis.    Amy Oswald HillockS Esterwood PA-C 08/03/2017   Cc: [place], Betty G, MD

## 2017-08-03 NOTE — Telephone Encounter (Signed)
Pt called and needs to have singulair called into [facility] on [address] [phone].

## 2017-08-04 NOTE — Progress Notes (Signed)
I agree with the above note, plan 

## 2017-08-05 ENCOUNTER — Encounter: Payer: Self-pay | Admitting: Family Medicine

## 2017-09-03 ENCOUNTER — Other Ambulatory Visit: Payer: Self-pay | Admitting: Allergy

## 2017-09-03 DIAGNOSIS — H1010 Acute atopic conjunctivitis, unspecified eye: Secondary | ICD-10-CM

## 2017-09-03 DIAGNOSIS — J309 Allergic rhinitis, unspecified: Principal | ICD-10-CM

## 2017-09-27 ENCOUNTER — Ambulatory Visit: Payer: BLUE CROSS/BLUE SHIELD | Admitting: Allergy

## 2017-10-10 ENCOUNTER — Ambulatory Visit (INDEPENDENT_AMBULATORY_CARE_PROVIDER_SITE_OTHER): Payer: BLUE CROSS/BLUE SHIELD | Admitting: Allergy

## 2017-10-10 ENCOUNTER — Encounter: Payer: Self-pay | Admitting: Allergy

## 2017-10-10 VITALS — BP 108/70 | HR 76 | Resp 16 | Ht 73.0 in | Wt 185.0 lb

## 2017-10-10 DIAGNOSIS — J309 Allergic rhinitis, unspecified: Secondary | ICD-10-CM

## 2017-10-10 DIAGNOSIS — T781XXD Other adverse food reactions, not elsewhere classified, subsequent encounter: Secondary | ICD-10-CM

## 2017-10-10 DIAGNOSIS — J4540 Moderate persistent asthma, uncomplicated: Secondary | ICD-10-CM | POA: Diagnosis not present

## 2017-10-10 DIAGNOSIS — H1010 Acute atopic conjunctivitis, unspecified eye: Secondary | ICD-10-CM

## 2017-10-10 DIAGNOSIS — R7611 Nonspecific reaction to tuberculin skin test without active tuberculosis: Secondary | ICD-10-CM

## 2017-10-10 DIAGNOSIS — Z227 Latent tuberculosis: Secondary | ICD-10-CM

## 2017-10-10 MED ORDER — OLOPATADINE HCL 0.2 % OP SOLN: 1.0000 [drp] | Freq: Every day | OPHTHALMIC | 5 refills | Status: DC

## 2017-10-10 MED ORDER — FLUTICASONE PROPIONATE HFA 110 MCG/ACT IN AERO: INHALATION_SPRAY | RESPIRATORY_TRACT | 1 refills | Status: DC

## 2017-10-10 MED ORDER — MONTELUKAST SODIUM 10 MG PO TABS: 10.0000 mg | ORAL_TABLET | Freq: Every day | ORAL | 1 refills | Status: DC

## 2017-10-10 MED ORDER — FEXOFENADINE HCL 180 MG PO TABS: 180.0000 mg | ORAL_TABLET | Freq: Every day | ORAL | 1 refills | Status: AC

## 2017-10-10 NOTE — Progress Notes (Signed)
Follow-up Note  RE: Jeffrey Hall MRN: [id number] DOB: [birth year] Date of Office Visit: 10/10/2017   History of present illness: Jeffrey Hall is a 22 y.o. male presenting today for follow-up of asthma, allergic rhinoconjunctivitis, adverse food reaction.  He was last seen in the office on 04/05/17 by myself.  He states he has been doing well since his last.   He states he was able to activate his insurance and had his PCP repeat testing for TB as last year PPD testing was positive and it was recommended at that time that he be treated for latent TB with 3 month therapy however he did not have insurance and was not able to afford the medication.  Now that he has insurance he was able to get the regimen and has been on for past 2 months with 1 month left to complete.  He states he does seem to feel over better with this TB regimen on board.  He states he has occasional SOB and rarely uses the albuterol.  He is using medium dose Flovent 2 puffs twice a day with spacer.  He denies nighttime awakenings or need for ED/UC visits or oral steroids since last visit.   With his allergies he has been taking singulair and levocetirizine daily.  He states he has not required use of nasal spray (flonase) or eye drop.  He denies any further reactions to foods.     Review of systems: Review of Systems  Constitutional: Negative for chills, fever and malaise/fatigue.  HENT: Negative for congestion, ear discharge, ear pain, nosebleeds and sore throat.   Eyes: Negative for pain, discharge and redness.  Respiratory: Positive for shortness of breath. Negative for cough and wheezing.   Cardiovascular: Negative for chest pain.  Gastrointestinal: Negative for abdominal pain, constipation, diarrhea, nausea and vomiting.  Musculoskeletal: Negative for joint pain.  Skin: Negative for itching and rash.  Neurological: Negative for headaches.    All other systems negative unless noted above in HPI  Past  medical/social/surgical/family history have been reviewed and are unchanged unless specifically indicated below.  No changes  Medication List: Allergies as of 10/10/2017   No Known Allergies     Medication List        Accurate as of 10/10/17  2:31 PM. Always use your most recent med list.          BENEFIBER Powd Take by mouth.   DULoxetine 60 MG capsule Commonly known as:  CYMBALTA Take 1 capsule (60 mg total) by mouth daily.   fluticasone 110 MCG/ACT inhaler Commonly known as:  FLOVENT HFA 2 puffs 2 times daily with spacer   levocetirizine 5 MG tablet Commonly known as:  XYZAL TAKE 1 TABLET BY MOUTH ONCE DAILY EVERY EVENING   MILK OF MAGNESIA 400 MG/5ML suspension Generic drug:  magnesium hydroxide Take by mouth as needed.   montelukast 10 MG tablet Commonly known as:  SINGULAIR Take 1 tablet (10 mg total) by mouth at bedtime.       Known medication allergies: No Known Allergies   Physical examination: Blood pressure 108/70, pulse 76, resp. rate 16, height 6\' 1"  (1.854 m), weight 185 lb (83.9 kg), SpO2 97 %.  General: Alert, interactive, in no acute distress. HEENT: PERRLA, TMs pearly gray, turbinates minimally edematous without discharge, post-pharynx non erythematous. Neck: Supple without lymphadenopathy. Lungs: Clear to auscultation without wheezing, rhonchi or rales. {no increased work of breathing. CV: Normal S1, S2 without murmurs. Abdomen: Nondistended, nontender. Skin: Warm and  dry, without lesions or rashes. Extremities:  No clubbing, cyanosis or edema. Neuro:   Grossly intact.  Diagnositics/Labs: Spirometry: FEV1: 5.26L 121%, FVC: 6.71L 131%, ratio consistent with nonobstructive pattern  Assessment and plan:   Asthma, mod persistent    - symptoms are improved    - have access to albuterol inhaler 2 puffs every 4-6 hours as needed for cough/wheeze/shortness of breath/chest tightness.  May use 15-20 minutes prior to activity.   Monitor  frequency of use.      - continue Flovent 110 mcg 2 puffs twice a day with spacer - refilled today  Asthma control goals:   Full participation in all desired activities (may need albuterol before activity)  Albuterol use two time or less a week on average (not counting use with activity)  Cough interfering with sleep two time or less a month  Oral steroids no more than once a year  No hospitalizations  Latent TB    - continue and complete your medication course for latent TB as directed  Allergic rhinoconjunctivitis    - continue avoidance measures for tree pollen, mold, weed pollen and dog    - finish out your Levocetirizine.  Then will switch to Fexofenadine 180mg  daily.      - Avoidance measures provided.      - start Singulair 10mg  daily.  This should help with allergy symptoms and may help with itch    - for nasal congestion or drainage use OTC Flonase 1-2 sprays each nostril daily for 1-2 weeks at time before stopping once symptoms improve    - for itchy/watery/red eyes use Pataday or Pazeo 1 drop each eye as needed daily  Adverse food reaction  - he has not had any further symptoms with food ingestion  Follow-up 6-9 months or sooner if needed  I appreciate the opportunity to take part in [facility] care. Please do not hesitate to contact me with questions.  Sincerely,   Margo Aye, MD Allergy/Immunology [facility]

## 2017-10-10 NOTE — Patient Instructions (Signed)
Asthma    - symptoms are improved    - have access to albuterol inhaler 2 puffs every 4-6 hours as needed for cough/wheeze/shortness of breath/chest tightness.  May use 15-20 minutes prior to activity.   Monitor frequency of use.      - continue Flovent 110 mcg 2 puffs twice a day with spacer - refilled today  Asthma control goals:   Full participation in all desired activities (may need albuterol before activity)  Albuterol use two time or less a week on average (not counting use with activity)  Cough interfering with sleep two time or less a month  Oral steroids no more than once a year  No hospitalizations  Latent TB    - continue and complete your medication course for latent TB as directed  Allergies    - continue avoidance measures for tree pollen, mold, weed pollen and dog    - finish out your Levocetirizine.  Then will switch to Fexofenadine 180mg  daily.      - Avoidance measures provided.      - start Singulair 10mg  daily.  This should help with allergy symptoms and may help with itch    - for nasal congestion or drainage use OTC Flonase 1-2 sprays each nostril daily for 1-2 weeks at time before stopping once symptoms improve    - for itchy/watery/red eyes use Pataday or Pazeo 1 drop each eye as needed daily  Dry skin     - continue moisturization with emollients like Eucerin, Aquafor, CeraVe, Vaseline, Vanicream are all good options   Follow-up 6-9 months or sooner if needed

## 2017-10-22 DIAGNOSIS — F329 Major depressive disorder, single episode, unspecified: Secondary | ICD-10-CM | POA: Insufficient documentation

## 2017-11-02 ENCOUNTER — Encounter: Payer: Self-pay | Admitting: Physician Assistant

## 2017-11-02 ENCOUNTER — Ambulatory Visit (INDEPENDENT_AMBULATORY_CARE_PROVIDER_SITE_OTHER): Payer: BLUE CROSS/BLUE SHIELD | Admitting: Physician Assistant

## 2017-11-02 DIAGNOSIS — F411 Generalized anxiety disorder: Secondary | ICD-10-CM | POA: Diagnosis not present

## 2017-11-02 DIAGNOSIS — Z8615 Personal history of latent tuberculosis infection: Secondary | ICD-10-CM | POA: Insufficient documentation

## 2017-11-02 DIAGNOSIS — F331 Major depressive disorder, recurrent, moderate: Secondary | ICD-10-CM | POA: Diagnosis not present

## 2017-11-02 MED ORDER — DULOXETINE HCL 60 MG PO CPEP: 120.0000 mg | ORAL_CAPSULE | Freq: Every day | ORAL | 1 refills | Status: DC

## 2017-11-02 NOTE — Progress Notes (Signed)
[facility]  Patient ID: Jeffrey Hall,  MRN: [id number]  PCP: [place], Betty G, MD  Date of Evaluation: 11/02/2017 Time spent:15 minutes   HISTORY/CURRENT STATUS: HPI patient presents for routine 20-month med check.  There have been no changes in medical or social history since the last visit.  He continues to take Cymbalta 120 mg daily.  If he does not take it for a day or 2 by accident, the anxiety worsens.  Otherwise, he feels that the medication is working well.  He denies anhedonia, decreased energy or motivation, no isolating.  He does sometimes have a generalized sense of nervousness especially if he is in a crowd of people.  It is much better since he is been on the Cymbalta though.  He sleeps well.  He is finishing his last day of treatment tomorrow for latent tuberculosis.  He states he has never had any symptoms or abnormalities on chest x-ray. He continues to complain of constipation.  He takes milk of magnesia almost on a daily basis and does have a normal bowel movement every day.  If he does not take it he will have a normal bowel movement every 2 to 3 days.  He denies any intermittent diarrhea or abdominal cramps.  He denies straining or having very hard stools.  He is seeing his PCP had a CT of the abdomen and pelvis over the summer which was negative.  He is concerned that the Cymbalta may be causing this problem but is unsure when the constipation started versus when he started the Cymbalta.  Past medications for mental health diagnoses include: Zoloft which was not effective.  I am uncertain what the dose he was on however.   Individual Medical History/ Review of Systems: Changes? :No  Allergies: Patient has no known allergies.    Current Medications:  Current Outpatient Medications:  .  DULoxetine (CYMBALTA) 60 MG capsule, Take 2 capsules (120 mg total) by mouth daily., Disp: 180 capsule, Rfl: 1 .  fexofenadine (ALLEGRA) 180 MG tablet, Take 1 tablet  (180 mg total) by mouth daily., Disp: 90 tablet, Rfl: 1 .  fluticasone (FLOVENT HFA) 110 MCG/ACT inhaler, 2 puffs 2 times daily with spacer, Disp: 36 g, Rfl: 1 .  levocetirizine (XYZAL) 5 MG tablet, TAKE 1 TABLET BY MOUTH ONCE DAILY EVERY EVENING, Disp: , Rfl: 5 .  magnesium hydroxide (MILK OF MAGNESIA) 400 MG/5ML suspension, Take by mouth as needed. , Disp: , Rfl:  .  montelukast (SINGULAIR) 10 MG tablet, Take 1 tablet (10 mg total) by mouth at bedtime., Disp: 90 tablet, Rfl: 1 .  Olopatadine HCl (PATADAY) 0.2 % SOLN, Place 1 drop into both eyes daily., Disp: 1 Bottle, Rfl: 5 .  Wheat Dextrin (BENEFIBER) POWD, Take by mouth., Disp: , Rfl:  Medication Side Effects: None  Family Medical/ Social History: Changes? No  MENTAL HEALTH EXAM:  There were no vitals taken for this visit.There is no height or weight on file to calculate BMI.  General Appearance: Well Groomed  Eye Contact:  Good  Speech:  Clear and Coherent w/ African accent  Volume:  Normal  Mood:  Euthymic  Affect:  Appropriate  Thought Process:  Goal Directed  Orientation:  Full (Time, Place, and Person)  Thought Content: Logical   Suicidal Thoughts:  No  Homicidal Thoughts:  No  Memory:  Immediate  Judgement:  Good  Insight:  Good  Psychomotor Activity:  Normal  Concentration:  Concentration: Good  Recall:  Good  Fund  of Knowledge: Good  Language: Good  Akathisia:  No  AIMS (if indicated): not done  Assets:  Desire for Improvement  ADL's:  Intact  Cognition: WNL  Prognosis:  Good    DIAGNOSES:    ICD-10-CM   1. Generalized anxiety disorder F41.1 DULoxetine (CYMBALTA) 60 MG capsule  2. Major depressive disorder, recurrent episode, moderate (HCC) F33.1     RECOMMENDATIONS: I have tried to reassure him that it is not necessarily abnormal to have a bowel movement every 2 to 3 days for some people.  However if he has other symptoms like nausea or vomiting, abdominal pain etc., he should see his PCP again for this  problem.  It is uncertain as to whether the Cymbalta is causing the constipation.  I recommended he increase fiber in his diet, drink more water, either use Dulcolax or Senokot or another stool softener. Continue Cymbalta as noted above. Return in 3 months.   Melony Overly, PA-C

## 2017-11-08 ENCOUNTER — Other Ambulatory Visit: Payer: Self-pay | Admitting: Allergy

## 2017-11-08 DIAGNOSIS — H1010 Acute atopic conjunctivitis, unspecified eye: Secondary | ICD-10-CM

## 2017-11-08 DIAGNOSIS — J309 Allergic rhinitis, unspecified: Principal | ICD-10-CM

## 2017-11-09 ENCOUNTER — Other Ambulatory Visit: Payer: Self-pay | Admitting: *Deleted

## 2017-11-09 ENCOUNTER — Other Ambulatory Visit: Payer: Self-pay | Admitting: Allergy

## 2017-11-09 ENCOUNTER — Telehealth: Payer: Self-pay | Admitting: Allergy

## 2017-11-09 DIAGNOSIS — J309 Allergic rhinitis, unspecified: Principal | ICD-10-CM

## 2017-11-09 DIAGNOSIS — H1010 Acute atopic conjunctivitis, unspecified eye: Secondary | ICD-10-CM

## 2017-11-09 MED ORDER — FEXOFENADINE HCL 180 MG PO TABS: 180.0000 mg | ORAL_TABLET | Freq: Every day | ORAL | 1 refills | Status: DC

## 2017-11-09 NOTE — Telephone Encounter (Signed)
Called and left message for patient to call office in regards to this matter. Patient was seen 10/10/2017 and was informed to finish is Xyzal then switch to allegra.

## 2017-11-09 NOTE — Telephone Encounter (Signed)
Patient called back. Explained to patient what was documented in the last patient note about switching to [facility] (Jeffrey Hall) after completing Xyzal. Patient verbalized understanding. Prescription for Allegra has been sent in per patient's request to the [facility] at [facility].

## 2017-11-09 NOTE — Telephone Encounter (Signed)
Pharmacy:: [facility] on [address] 90 day supply levocetirizine pharmacy have no more refills  Patient has 6 mo check in march

## 2017-11-13 ENCOUNTER — Encounter: Payer: Self-pay | Admitting: Family Medicine

## 2018-01-09 ENCOUNTER — Encounter: Payer: Self-pay | Admitting: Emergency Medicine

## 2018-01-25 ENCOUNTER — Ambulatory Visit (INDEPENDENT_AMBULATORY_CARE_PROVIDER_SITE_OTHER): Payer: BLUE CROSS/BLUE SHIELD | Admitting: Family Medicine

## 2018-01-25 ENCOUNTER — Encounter: Payer: Self-pay | Admitting: Family Medicine

## 2018-01-25 ENCOUNTER — Telehealth: Payer: Self-pay | Admitting: Physician Assistant

## 2018-01-25 VITALS — BP 118/70 | HR 75 | Temp 98.8°F | Resp 12 | Ht 73.0 in | Wt 192.0 lb

## 2018-01-25 DIAGNOSIS — Z23 Encounter for immunization: Secondary | ICD-10-CM

## 2018-01-25 DIAGNOSIS — L298 Other pruritus: Secondary | ICD-10-CM | POA: Diagnosis not present

## 2018-01-25 DIAGNOSIS — K5904 Chronic idiopathic constipation: Secondary | ICD-10-CM

## 2018-01-25 DIAGNOSIS — J309 Allergic rhinitis, unspecified: Secondary | ICD-10-CM | POA: Diagnosis not present

## 2018-01-25 DIAGNOSIS — Z227 Latent tuberculosis: Secondary | ICD-10-CM

## 2018-01-25 MED ORDER — LINACLOTIDE 145 MCG PO CAPS: 145.0000 ug | ORAL_CAPSULE | Freq: Every day | ORAL | 1 refills | Status: DC

## 2018-01-25 MED ORDER — TRIAMCINOLONE ACETONIDE 0.1 % EX CREA: 1.0000 "application " | TOPICAL_CREAM | Freq: Two times a day (BID) | CUTANEOUS | 1 refills | Status: DC

## 2018-01-25 NOTE — Patient Instructions (Signed)
A few things to remember from today's visit:   Chronic idiopathic constipation - Plan: linaclotide (LINZESS) 145 MCG CAPS capsule  Hx of TB lung, latent  Pruritic erythematous rash - Plan: triamcinolone cream (KENALOG) 0.1 %  Allergic rhinitis, unspecified seasonality, unspecified trigger Thick mucus in your throat could be related to allergies. Continue following with allergy doctor.  Rash on your neck could be eczema, apply topical steroid very small amount at a time. For dry skin you can use over-the-counter cetaphil as needed.  For constipation try Linzess. Benefiber twice daily may also help. Adequate hydration.  Please be sure medication list is accurate. If a new problem present, please set up appointment sooner than planned today.

## 2018-01-25 NOTE — Progress Notes (Signed)
HPI:  Chief Complaint  Patient presents with  . Rash    started 1 week ago    Jeffrey Hall is a 23 y.o. male, who is here today with a few concerns. Chronic problem management.  Today he is c/o pruritic rash for about a week around neck.  He denies any new medication, detergent, soap, or body product. No known insect bite or outdoor exposures to plants. No sick contact. No Hx of eczema or similar rash in the past. He has Hx of allergies.  OTC medication for this problem: None.   He denies oral lesions/edema,associated abdominal pain, nausea, or vomiting. Also concerned about dry skin, areas on face and arms "ashy."  Since his last visit he has followed with immunologist and gastroenterologist.  According to patient, work-up was negative but he is still having constipation. He is taking milk of magnesium every other day, it causes diarrhea but helping with abdominal bloating sensation.   Occasional he feels thick mucus in his throat, hard to cough it up. He is not sure about exacerbating or alleviating factors. Allergic rhinitis, he is on Allegra and Singulair.  He denies hemoptysis. He recently completed 3 months of latent TB treatment. Negative for cough, dyspnea,wheezing,or chest pain.   Review of Systems  Constitutional: Positive for fatigue. Negative for appetite change and fever.  HENT: Positive for postnasal drip and rhinorrhea. Negative for mouth sores and sore throat.   Respiratory: Positive for cough. Negative for shortness of breath and wheezing.   Gastrointestinal: Positive for constipation. Negative for abdominal pain, nausea and vomiting.  Musculoskeletal: Negative for gait problem and myalgias.  Skin: Positive for rash. Negative for wound.  Allergic/Immunologic: Positive for environmental allergies.  Neurological: Negative for weakness, numbness and headaches.  Hematological: Negative for adenopathy. Does not bruise/bleed easily.       Current Outpatient Medications on File Prior to Visit  Medication Sig Dispense Refill  . DULoxetine (CYMBALTA) 60 MG capsule Take 2 capsules (120 mg total) by mouth daily. 180 capsule 1  . fexofenadine (ALLEGRA) 180 MG tablet Take 1 tablet (180 mg total) by mouth daily. 90 tablet 1  . fluticasone (FLOVENT HFA) 110 MCG/ACT inhaler 2 puffs 2 times daily with spacer 36 g 1  . levocetirizine (XYZAL) 5 MG tablet TAKE 1 TABLET BY MOUTH ONCE DAILY EVERY EVENING  5  . magnesium hydroxide (MILK OF MAGNESIA) 400 MG/5ML suspension Take by mouth as needed.     . montelukast (SINGULAIR) 10 MG tablet Take 1 tablet (10 mg total) by mouth at bedtime. 90 tablet 1  . Wheat Dextrin (BENEFIBER) POWD Take by mouth.     No current facility-administered medications on file prior to visit.      Past Medical History:  Diagnosis Date  . Asthma   . Eczema   . Frequent headaches   . Tuberculosis   . Urticaria    No Known Allergies  Social History   Socioeconomic History  . Marital status: Single    Spouse name: Not on file  . Number of children: Not on file  . Years of education: Not on file  . Highest education level: Not on file  Occupational History  . Occupation: STUDENT     Comment: [employer]  Social Needs  . Financial resource strain: Not on file  . Food insecurity:    Worry: Not on file    Inability: Not on file  . Transportation needs:    Medical: Not on file  Non-medical: Not on file  Tobacco Use  . Smoking status: Never Smoker  . Smokeless tobacco: Never Used  Substance and Sexual Activity  . Alcohol use: No  . Drug use: No  . Sexual activity: Not Currently  Lifestyle  . Physical activity:    Days per week: Not on file    Minutes per session: Not on file  . Stress: Not on file  Relationships  . Social connections:    Talks on phone: Not on file    Gets together: Not on file    Attends religious service: Not on file    Active member of club or organization: Not on file     Attends meetings of clubs or organizations: Not on file    Relationship status: Not on file  Other Topics Concern  . Not on file  Social History Narrative  . Not on file    Vitals:   01/25/18 0910  BP: 118/70  Pulse: 75  Resp: 12  Temp: 98.8 F (37.1 C)  SpO2: 98%   Body mass index is 25.33 kg/m.   Physical Exam  Nursing note and vitals reviewed. Constitutional: He is oriented to person, place, and time. He appears well-developed and well-nourished. No distress.  HENT:  Head: Normocephalic and atraumatic.  Mouth/Throat: Oropharynx is clear and moist and mucous membranes are normal.  Eyes: Pupils are equal, round, and reactive to light. Conjunctivae are normal.  Cardiovascular: Normal rate and regular rhythm.  No murmur heard. Respiratory: Effort normal and breath sounds normal. No respiratory distress.  GI: Soft. He exhibits no mass. There is no hepatomegaly. There is no abdominal tenderness.  Musculoskeletal:        General: No edema.  Lymphadenopathy:    He has no cervical adenopathy.  Neurological: He is alert and oriented to person, place, and time. He has normal strength. Gait normal.  Skin: Skin is warm. Rash noted. No erythema.  Micropapular erythematous rash around neck, L>R. It is not tender.  Psychiatric: His mood appears anxious.  Well groomed, good eye contact.    ASSESSMENT AND PLAN:  [name] was seen today for rash.   Orders Placed This Encounter  Procedures  . Flu Vaccine QUAD 36+ mos IM    Pruritic erythematous rash Possible etiologies discussed. ? Eczema. Topical steroid side effects discussed,recommed small amount on area bid for up to 14 days at the time. F/U as needed.  -     triamcinolone cream (KENALOG) 0.1 %; Apply 1 application topically 2 (two) times daily. Up to 14 days at the time.  Chronic idiopathic constipation IBS-C Caution with Mild of Mg, risk of electrolyte abnormalities. After discussing other pharmacologic  options,he agrees with trying Linzess.Side effects discussed.  -     linaclotide (LINZESS) 145 MCG CAPS capsule; Take 1 capsule (145 mcg total) by mouth daily before breakfast.  Allergic rhinitis, unspecified seasonality, unspecified trigger Nasal irrigations with saline. Continue Allegra and Singulair. Continue following with immunologist.  Hx of TB lung, latent Completed treatment and released by [facility].  Need for immunization against influenza -     Flu Vaccine QUAD 36+ mos IM      Shanisha Lech G. [place], MD  [facility]. [place] office.

## 2018-01-25 NOTE — Telephone Encounter (Signed)
PATIENT NEED REFILL ON DULOXETINE 60 MG TO BE SENT TO [facility] ON [address] APPT WAS RS TO 02/20/2018

## 2018-01-25 NOTE — Telephone Encounter (Signed)
Pt notified to check with pharmacy should have refills on file.

## 2018-01-26 ENCOUNTER — Encounter: Payer: Self-pay | Admitting: Family Medicine

## 2018-01-29 ENCOUNTER — Ambulatory Visit: Payer: BLUE CROSS/BLUE SHIELD | Admitting: Physician Assistant

## 2018-01-30 ENCOUNTER — Telehealth: Payer: Self-pay | Admitting: *Deleted

## 2018-01-30 ENCOUNTER — Other Ambulatory Visit: Payer: Self-pay | Admitting: *Deleted

## 2018-01-30 MED ORDER — LUBIPROSTONE 8 MCG PO CAPS: 8.0000 ug | ORAL_CAPSULE | Freq: Two times a day (BID) | ORAL | 3 refills | Status: DC

## 2018-01-30 NOTE — Telephone Encounter (Signed)
Linzess 145 mcg cap not covered by insurance

## 2018-01-30 NOTE — Telephone Encounter (Signed)
We can try Amitiza 8 mg twice daily.  If insurance does not cover this medication either, he can continue with current OTC treatment.  Thanks, BJ

## 2018-01-30 NOTE — Telephone Encounter (Signed)
Rx sent to the pharmacy.

## 2018-02-19 ENCOUNTER — Encounter: Payer: Self-pay | Admitting: Family Medicine

## 2018-02-20 ENCOUNTER — Encounter: Payer: Self-pay | Admitting: Physician Assistant

## 2018-02-20 ENCOUNTER — Ambulatory Visit: Payer: BLUE CROSS/BLUE SHIELD | Admitting: Physician Assistant

## 2018-02-20 DIAGNOSIS — F331 Major depressive disorder, recurrent, moderate: Secondary | ICD-10-CM | POA: Diagnosis not present

## 2018-02-20 DIAGNOSIS — F411 Generalized anxiety disorder: Secondary | ICD-10-CM | POA: Diagnosis not present

## 2018-02-20 MED ORDER — DULOXETINE HCL 60 MG PO CPEP: 120.0000 mg | ORAL_CAPSULE | Freq: Every day | ORAL | 1 refills | Status: DC

## 2018-02-20 NOTE — Progress Notes (Signed)
[facility]  Patient ID: Jeffrey Hall,  MRN: [id number]  PCP: [place], Betty G, MD  Date of Evaluation: 02/20/2018 Time spent:15 minutes  Chief Complaint:  Chief Complaint    Follow-up      HISTORY/CURRENT STATUS: HPI here for 37-month follow-up.  States he is doing very well as far as anxiety and depression go.  Cymbalta continues to work well.Patient denies loss of interest in usual activities and is able to enjoy things.  Denies decreased motivation.  States he is tired a lot though.  He sees his family practitioner for that.  Appetite has not changed.  No extreme sadness, tearfulness, or feelings of hopelessness.  Denies any changes in concentration, making decisions or remembering things.  Denies suicidal or homicidal thoughts.  He sleeps well.  Patient denies change in appetite, weight loss or weight gain, fever or chills, weakness.  Individual Medical History/ Review of Systems: Changes?  Chronic constipation.  Allergies: Patient has no known allergies.  Current Medications:  Current Outpatient Medications:  .  DULoxetine (CYMBALTA) 60 MG capsule, Take 2 capsules (120 mg total) by mouth daily., Disp: 180 capsule, Rfl: 1 .  fexofenadine (ALLEGRA) 180 MG tablet, Take 1 tablet (180 mg total) by mouth daily., Disp: 90 tablet, Rfl: 1 .  fluticasone (FLOVENT HFA) 110 MCG/ACT inhaler, 2 puffs 2 times daily with spacer, Disp: 36 g, Rfl: 1 .  montelukast (SINGULAIR) 10 MG tablet, Take 1 tablet (10 mg total) by mouth at bedtime., Disp: 90 tablet, Rfl: 1 .  triamcinolone cream (KENALOG) 0.1 %, Apply 1 application topically 2 (two) times daily. Up to 14 days at the time., Disp: 30 g, Rfl: 1 .  Wheat Dextrin (BENEFIBER) POWD, Take by mouth., Disp: , Rfl:  .  levocetirizine (XYZAL) 5 MG tablet, TAKE 1 TABLET BY MOUTH ONCE DAILY EVERY EVENING, Disp: , Rfl: 5 .  lubiprostone (AMITIZA) 8 MCG capsule, Take 1 capsule (8 mcg total) by mouth 2 (two) times daily with a meal.  (Patient not taking: Reported on 02/20/2018), Disp: 60 capsule, Rfl: 3 .  magnesium hydroxide (MILK OF MAGNESIA) 400 MG/5ML suspension, Take by mouth as needed. , Disp: , Rfl:  Medication Side Effects: none  Family Medical/ Social History: Changes? No  MENTAL HEALTH EXAM:  There were no vitals taken for this visit.There is no height or weight on file to calculate BMI.  General Appearance: Casual and Well Groomed  Eye Contact:  Good  Speech:  Clear and Coherent  Volume:  Normal  Mood:  Euthymic  Affect:  Appropriate  Thought Process:  Goal Directed  Orientation:  Full (Time, Place, and Person)  Thought Content: Logical   Suicidal Thoughts:  No  Homicidal Thoughts:  No  Memory:  WNL  Judgement:  Good  Insight:  Good  Psychomotor Activity:  Normal  Concentration:  Concentration: Good  Recall:  Good  Fund of Knowledge: Good  Language: Good  Assets:  Desire for Improvement  ADL's:  Intact  Cognition: WNL  Prognosis:  Good    DIAGNOSES:    ICD-10-CM   1. Major depressive disorder, recurrent episode, moderate (HCC) F33.1   2. Generalized anxiety disorder F41.1 DULoxetine (CYMBALTA) 60 MG capsule    Receiving Psychotherapy: No    RECOMMENDATIONS: Continue Cymbalta 60 mg 2 every morning. Discussed wellness care with increased exercise, diet high in fiber, increase water intake.  He should get back with PCP if the fatigue continues. Return in 6 months or sooner as needed.  Rosey Bath  Adelene Idler, PA-C

## 2018-02-21 ENCOUNTER — Encounter: Payer: Self-pay | Admitting: Family Medicine

## 2018-02-22 ENCOUNTER — Other Ambulatory Visit: Payer: Self-pay | Admitting: Family Medicine

## 2018-02-22 DIAGNOSIS — L309 Dermatitis, unspecified: Secondary | ICD-10-CM

## 2018-02-22 MED ORDER — TRIAMCINOLONE 0.1 % CREAM:EUCERIN CREAM 1:1: 1.0000 "application " | TOPICAL_CREAM | Freq: Two times a day (BID) | CUTANEOUS | 1 refills | Status: DC | PRN

## 2018-03-18 ENCOUNTER — Encounter: Payer: Self-pay | Admitting: Family Medicine

## 2018-04-10 ENCOUNTER — Ambulatory Visit: Payer: BLUE CROSS/BLUE SHIELD | Admitting: Allergy

## 2018-05-04 ENCOUNTER — Other Ambulatory Visit: Payer: Self-pay | Admitting: Allergy

## 2018-05-04 DIAGNOSIS — J4540 Moderate persistent asthma, uncomplicated: Secondary | ICD-10-CM

## 2018-08-04 ENCOUNTER — Other Ambulatory Visit: Payer: Self-pay | Admitting: Allergy

## 2018-08-22 ENCOUNTER — Ambulatory Visit: Payer: BLUE CROSS/BLUE SHIELD | Admitting: Physician Assistant

## 2018-10-08 ENCOUNTER — Telehealth: Payer: Self-pay | Admitting: Allergy

## 2018-10-08 MED ORDER — MONTELUKAST SODIUM 10 MG PO TABS: 10.0000 mg | ORAL_TABLET | Freq: Every day | ORAL | 0 refills | Status: DC

## 2018-10-08 NOTE — Telephone Encounter (Signed)
Sent 1 courtesy refill of montelukast given his situation.

## 2018-10-08 NOTE — Telephone Encounter (Signed)
As I was pre registering today, I came across this patient and he has the out of network insurance [employer]. I called the patient and explained to him that he was out of network and  gave him the option of keeping the appointment and paying out of pocket with the 55% discount, or cancelling. He chose to cancel. I gave him the address and phone # of the [facility] practice that would accept his insurance. He said he is out of his Montelukast and was wondering if this could be filled because he did not know when he could get in with a new doctor. [place].

## 2018-10-09 ENCOUNTER — Ambulatory Visit: Payer: BLUE CROSS/BLUE SHIELD | Admitting: Allergy

## 2018-11-23 ENCOUNTER — Other Ambulatory Visit: Payer: Self-pay | Admitting: Physician Assistant

## 2018-11-23 DIAGNOSIS — F411 Generalized anxiety disorder: Secondary | ICD-10-CM

## 2018-12-24 ENCOUNTER — Other Ambulatory Visit: Payer: Self-pay | Admitting: Allergy

## 2018-12-25 ENCOUNTER — Other Ambulatory Visit: Payer: Self-pay | Admitting: Allergy

## 2018-12-26 ENCOUNTER — Other Ambulatory Visit: Payer: Self-pay | Admitting: Allergy

## 2018-12-26 ENCOUNTER — Encounter (HOSPITAL_COMMUNITY): Payer: Self-pay | Admitting: Emergency Medicine

## 2018-12-26 ENCOUNTER — Other Ambulatory Visit: Payer: Self-pay

## 2018-12-26 ENCOUNTER — Emergency Department (HOSPITAL_COMMUNITY)
Admission: EM | Admit: 2018-12-26 | Discharge: 2018-12-26 | Disposition: A | Discharge status: Home / Self Care | Payer: BLUE CROSS/BLUE SHIELD | Attending: Emergency Medicine | Admitting: Emergency Medicine

## 2018-12-26 DIAGNOSIS — Z8709 Personal history of other diseases of the respiratory system: Secondary | ICD-10-CM | POA: Insufficient documentation

## 2018-12-26 DIAGNOSIS — Z760 Encounter for issue of repeat prescription: Secondary | ICD-10-CM | POA: Insufficient documentation

## 2018-12-26 DIAGNOSIS — Z79899 Other long term (current) drug therapy: Secondary | ICD-10-CM | POA: Diagnosis not present

## 2018-12-26 MED ORDER — MONTELUKAST SODIUM 10 MG PO TABS: 10.0000 mg | ORAL_TABLET | Freq: Every day | ORAL | 0 refills | Status: DC

## 2018-12-26 NOTE — Discharge Instructions (Addendum)
Please follow-up with your primary care doctor and your allergy doctor regarding further refills of your medication.  Return the emergency department for any difficulty breathing, chest pain or any other worsening or concerning symptoms.

## 2018-12-26 NOTE — Telephone Encounter (Signed)
Patient called in for medication refill. Medication is refused please contact patient.    Call back [phone]

## 2018-12-26 NOTE — ED Notes (Signed)
Pt wanted to leave AMA, encouraged to stay to wait for a room to see provider

## 2018-12-26 NOTE — Telephone Encounter (Signed)
Refill request came in to Allergy and Asthma for generic singulair. Patient has not been seen in our clinic since 10/10/2017. Patient is overdue for an office visit and will need to contact the [facility] [place].

## 2018-12-26 NOTE — ED Notes (Signed)
Patient verbalizes understanding of discharge instructions. Opportunity for questioning and answers were provided. Pt discharged from ED. 

## 2018-12-26 NOTE — ED Provider Notes (Signed)
[facility]   CSN: [id number] Arrival date & time: 12/26/18  1847     History Chief Complaint  Patient presents with  . Medication Refill    Jeffrey Hall is a 23 y.o. male has no history of asthma, eczema who presents for evaluation of wanting refills of his medication.  Patient reports that he sees an allergy doctor and gets montelukast which he takes daily.  He states that he was feeling good the last time he had an appointment so he rescheduled it.  He states that the second reschedule, he was unable to make the appointment.  He states that he started running out of his montelukast and called the allergy doctor but since he had not seen them in a while, they wanted to arrange an appointment before they would refill his medication.  Patient states he came here for a refill.  He is currently denying any chest pain, difficulty breathing.  The history is provided by the patient.       Past Medical History:  Diagnosis Date  . Asthma   . Eczema   . Frequent headaches   . Tuberculosis   . Urticaria     Patient Active Problem List   Diagnosis Date Noted  . Hx of TB lung, latent 01/25/2018  . History of latent tuberculosis 11/02/2017  . MDD (major depressive disorder) 10/22/2017  . Heart palpitations 02/20/2017  . Myalgia 01/22/2017  . Fatigue 01/22/2017  . Generalized anxiety disorder 12/29/2016  . Allergic rhinitis 11/21/2016  . Constipation 11/21/2016  . Multiple somatic complaints 11/21/2016  . Premature ejaculation 04/21/2016  . Asthma, persistent not controlled 04/21/2016    Past Surgical History:  Procedure Laterality Date  . NO PAST SURGERIES         Family History  Problem Relation Age of Onset  . Cancer Neg Hx   . Heart disease Neg Hx   . Hypertension Neg Hx   . Hyperlipidemia Neg Hx   . Depression Neg Hx     Social History   Tobacco Use  . Smoking status: Never Smoker  . Smokeless  tobacco: Never Used  Substance Use Topics  . Alcohol use: No  . Drug use: No    Home Medications Prior to Admission medications   Medication Sig Start Date End Date Taking? Authorizing Provider  DULoxetine (CYMBALTA) 60 MG capsule Take 2 capsules by mouth once daily 11/24/18   Claybon JabsHurst, Rosey Batheresa T, PA-C  fexofenadine (ALLEGRA) 180 MG tablet Take 1 tablet (180 mg total) by mouth daily. 10/10/17   Marcelyn BruinsPadgett, Shaylar Patricia, MD  fluticasone (FLOVENT HFA) 110 MCG/ACT inhaler INHALE 2 PUFFS BY MOUTH TWICE DAILY WITH  SPACER 05/06/18   Padgett, Pilar GrammesShaylar Patricia, MD  levocetirizine (XYZAL) 5 MG tablet TAKE 1 TABLET BY MOUTH ONCE DAILY EVERY EVENING 07/11/17   [provider]  lubiprostone (AMITIZA) 8 MCG capsule Take 1 capsule (8 mcg total) by mouth 2 (two) times daily with a meal. Patient not taking: Reported on 02/20/2018 01/30/18   [place]Jordan, Betty G, MD  magnesium hydroxide (MILK OF MAGNESIA) 400 MG/5ML suspension Take by mouth as needed.     [provider]  montelukast (SINGULAIR) 10 MG tablet Take 1 tablet (10 mg total) by mouth at bedtime. 12/26/18   Maxwell CaulLayden, Jasmane Brockway A, PA-C  Triamcinolone Acetonide (TRIAMCINOLONE 0.1 % CREAM : EUCERIN) CREA Apply 1 application topically 2 (two) times daily as needed for rash or itching. 02/22/18   [place], Betty G,  MD  triamcinolone cream (KENALOG) 0.1 % Apply 1 application topically 2 (two) times daily. Up to 14 days at the time. 01/25/18   [place], Betty G, MD  Wheat Dextrin (BENEFIBER) POWD Take by mouth.    [provider]    Allergies    Patient has no known allergies.  Review of Systems   Review of Systems  Respiratory: Negative for shortness of breath.   Cardiovascular: Negative for chest pain.  Gastrointestinal: Negative for vomiting.  All other systems reviewed and are negative.   Physical Exam Updated Vital Signs BP 129/78 (BP Location: Left Arm)   Pulse 79   Temp 98.6 F (37 C) (Oral)   SpO2 98%   Physical Exam Vitals and  nursing note reviewed.  Constitutional:      Appearance: He is well-developed.  HENT:     Head: Normocephalic and atraumatic.  Eyes:     General: No scleral icterus.       Right eye: No discharge.        Left eye: No discharge.     Conjunctiva/sclera: Conjunctivae normal.  Pulmonary:     Effort: Pulmonary effort is normal.     Breath sounds: Normal breath sounds.     Comments: Lungs clear to auscultation bilaterally.  Symmetric chest rise.  No wheezing, rales, rhonchi. No evidence of respiratory distress. Skin:    General: Skin is warm and dry.  Neurological:     Mental Status: He is alert.  Psychiatric:        Speech: Speech normal.        Behavior: Behavior normal.     ED Results / Procedures / Treatments   Labs (all labs ordered are listed, but only abnormal results are displayed) Labs Reviewed - No data to display  EKG None  Radiology No results found.  Procedures Procedures (including critical care time)  Medications Ordered in ED Medications - No data to display  ED Course  I have reviewed the triage vital signs and the nursing notes.  Pertinent labs & imaging results that were available during my care of the patient were reviewed by me and considered in my medical decision making (see chart for details).    MDM Rules/Calculators/A&P       23 year old male who presents for evaluation of medication refill.  Reports that he takes montelukast daily and states that he ran out of his medication.  His allergy doctor stated that he needs an appointment before he could refill the medication since he has not seen them in over a year.  He currently denies any symptoms but is worried about going without his medications. Patient is afebrile, non-toxic appearing, sitting comfortably on examination table. Vital signs reviewed and stable.  No evidence of respiratory distress.  No evidence of wheezing.  Discussed with patient.  Will give short course of refill of his  medication.  Instructed to follow-up with his both his primary care doctor and his allergy doctor. At this time, patient exhibits no emergent life-threatening condition that require further evaluation in ED or admission. Patient had ample opportunity for questions and discussion. All patient's questions were answered with full understanding. Strict return precautions discussed. Patient expresses understanding and agreement to plan.   Portions of this note were generated with Scientist, clinical (histocompatibility and immunogenetics). Dictation errors may occur despite best attempts at proofreading.  Final Clinical Impression(s) / ED Diagnoses Final diagnoses:  Medication refill    Rx / DC Orders ED Discharge Orders  Ordered    montelukast (SINGULAIR) 10 MG tablet  Daily at bedtime    Note to Pharmacy: No additional refills   12/26/18 2019           Desma Mcgregor 12/26/18 2257    Tegeler, Gwenyth Allegra, MD 12/26/18 2340

## 2018-12-26 NOTE — ED Triage Notes (Signed)
Pt reports not being able to see his allergy doctor and needs refill of Montelukast.

## 2018-12-26 NOTE — Telephone Encounter (Signed)
Spoke with patient. Patient made aware that we will not be able to authorize any further refills at this time since he has already received a courtesy refill. Patient verbalized understanding. He declines an appointment at this time.

## 2018-12-26 NOTE — Telephone Encounter (Signed)
Patient called requesting a refill for Montelukast, [facility]. He told me he now has [facility], which we do not take. He was given the name of the [facility] that takes his insurance, but has not set up an appointment yet. He is requesting one last refill for this medication.

## 2018-12-27 NOTE — Telephone Encounter (Signed)
Pt went to ED and received refill.

## 2018-12-30 ENCOUNTER — Other Ambulatory Visit: Payer: Self-pay

## 2018-12-31 ENCOUNTER — Ambulatory Visit (INDEPENDENT_AMBULATORY_CARE_PROVIDER_SITE_OTHER): Payer: BLUE CROSS/BLUE SHIELD

## 2018-12-31 ENCOUNTER — Telehealth: Payer: Self-pay

## 2018-12-31 DIAGNOSIS — Z23 Encounter for immunization: Secondary | ICD-10-CM

## 2018-12-31 MED ORDER — MONTELUKAST SODIUM 10 MG PO TABS: 10.0000 mg | ORAL_TABLET | Freq: Every day | ORAL | 0 refills | Status: AC

## 2018-12-31 NOTE — Telephone Encounter (Signed)
Pt was here for a nurse visit. Pt stated he has an appt. Set up with the allergy doctor but it was cancelled due to pt insurance. Pt staed his insurance was not covered bc it was not local [employer]. Pt had to go to the ED just to have this refilled and stated that he really needs his allergy medication(montelukast (SINGULAIR) 10 MG tablet) Pt wants to know if he could have a refill until he is able to find a doctor.

## 2018-12-31 NOTE — Telephone Encounter (Signed)
It is okay to send 29-month supply of Singulair 10 mg to his pharmacy to continue taking once daily. Thanks, BJ

## 2018-12-31 NOTE — Addendum Note (Signed)
Addended by: Rodrigo Ran on: 12/31/2018 01:44 PM   Modules accepted: Orders

## 2018-12-31 NOTE — Telephone Encounter (Signed)
Rx sent in for 3 month supply

## 2018-12-31 NOTE — Telephone Encounter (Signed)
Okay to refill x 1 month until pt finds new allergy provider that takes his insurance?

## 2019-01-07 ENCOUNTER — Other Ambulatory Visit: Payer: Self-pay | Admitting: Physician Assistant

## 2019-01-07 DIAGNOSIS — F411 Generalized anxiety disorder: Secondary | ICD-10-CM

## 2019-02-24 ENCOUNTER — Ambulatory Visit: Payer: BLUE CROSS/BLUE SHIELD | Admitting: Physician Assistant

## 2019-03-25 ENCOUNTER — Emergency Department (HOSPITAL_COMMUNITY)
Admission: EM | Admit: 2019-03-25 | Discharge: 2019-03-28 | Disposition: A | Discharge status: Home / Self Care | Payer: BLUE CROSS/BLUE SHIELD | Attending: Emergency Medicine | Admitting: Emergency Medicine

## 2019-03-25 ENCOUNTER — Encounter (HOSPITAL_COMMUNITY): Payer: Self-pay | Admitting: Emergency Medicine

## 2019-03-25 DIAGNOSIS — F329 Major depressive disorder, single episode, unspecified: Secondary | ICD-10-CM | POA: Diagnosis present

## 2019-03-25 DIAGNOSIS — R4689 Other symptoms and signs involving appearance and behavior: Secondary | ICD-10-CM

## 2019-03-25 DIAGNOSIS — F411 Generalized anxiety disorder: Secondary | ICD-10-CM | POA: Diagnosis present

## 2019-03-25 DIAGNOSIS — J45909 Unspecified asthma, uncomplicated: Secondary | ICD-10-CM | POA: Insufficient documentation

## 2019-03-25 DIAGNOSIS — Z20822 Contact with and (suspected) exposure to covid-19: Secondary | ICD-10-CM | POA: Insufficient documentation

## 2019-03-25 DIAGNOSIS — Z79899 Other long term (current) drug therapy: Secondary | ICD-10-CM | POA: Insufficient documentation

## 2019-03-25 DIAGNOSIS — F29 Unspecified psychosis not due to a substance or known physiological condition: Secondary | ICD-10-CM | POA: Insufficient documentation

## 2019-03-25 LAB — CBC WITH DIFFERENTIAL/PLATELET
Abs Immature Granulocytes: 0.03 10*3/uL (ref 0.00–0.07)
Basophils Absolute: 0 10*3/uL (ref 0.0–0.1)
Basophils Relative: 0 %
Eosinophils Absolute: 0.1 10*3/uL (ref 0.0–0.5)
Eosinophils Relative: 1 %
HCT: 45.4 % (ref 39.0–52.0)
Hemoglobin: 15.1 g/dL (ref 13.0–17.0)
Immature Granulocytes: 0 %
Lymphocytes Relative: 10 %
Lymphs Abs: 1 10*3/uL (ref 0.7–4.0)
MCH: 29.1 pg (ref 26.0–34.0)
MCHC: 33.3 g/dL (ref 30.0–36.0)
MCV: 87.5 fL (ref 80.0–100.0)
Monocytes Absolute: 0.8 10*3/uL (ref 0.1–1.0)
Monocytes Relative: 8 %
Neutro Abs: 8.5 10*3/uL — ABNORMAL HIGH (ref 1.7–7.7)
Neutrophils Relative %: 81 %
Platelets: 224 10*3/uL (ref 150–400)
RBC: 5.19 MIL/uL (ref 4.22–5.81)
RDW: 12 % (ref 11.5–15.5)
WBC: 10.5 10*3/uL (ref 4.0–10.5)
nRBC: 0 % (ref 0.0–0.2)

## 2019-03-25 LAB — COMPREHENSIVE METABOLIC PANEL
ALT: 24 U/L (ref 0–44)
AST: 29 U/L (ref 15–41)
Albumin: 4.3 g/dL (ref 3.5–5.0)
Alkaline Phosphatase: 80 U/L (ref 38–126)
Anion gap: 12 (ref 5–15)
BUN: 13 mg/dL (ref 6–20)
CO2: 22 mmol/L (ref 22–32)
Calcium: 9.2 mg/dL (ref 8.9–10.3)
Chloride: 105 mmol/L (ref 98–111)
Creatinine, Ser: 1.04 mg/dL (ref 0.61–1.24)
GFR calc Af Amer: >60 mL/min (ref >60)
GFR calc non Af Amer: >60 mL/min (ref >60)
Glucose, Bld: 105 mg/dL — ABNORMAL HIGH (ref 70–99)
Potassium: 3.1 mmol/L — ABNORMAL LOW (ref 3.5–5.1)
Sodium: 139 mmol/L (ref 135–145)
Total Bilirubin: 0.8 mg/dL (ref 0.3–1.2)
Total Protein: 7.5 g/dL (ref 6.5–8.1)

## 2019-03-25 LAB — RESPIRATORY PANEL BY RT PCR (FLU A&B, COVID)
Influenza A by PCR: NEGATIVE
Influenza B by PCR: NEGATIVE
SARS Coronavirus 2 by RT PCR: NEGATIVE

## 2019-03-25 LAB — ETHANOL: Alcohol, Ethyl (B): <10 mg/dL (ref <10)

## 2019-03-25 MED ORDER — LORATADINE 10 MG PO TABS
10.0000 mg | ORAL_TABLET | Freq: Every day | ORAL | Status: DC
  Administered 2019-03-28: 10 mg via ORAL
  Filled 2019-03-25: qty 1

## 2019-03-25 MED ORDER — LORAZEPAM 2 MG/ML IJ SOLN
2.0000 mg | Freq: Once | INTRAMUSCULAR | Status: AC
  Administered 2019-03-25: 2 mg via INTRAMUSCULAR
  Filled 2019-03-25 (×2): qty 1

## 2019-03-25 MED ORDER — FLUTICASONE PROPIONATE HFA 110 MCG/ACT IN AERO
1.0000 | INHALATION_SPRAY | Freq: Two times a day (BID) | RESPIRATORY_TRACT | Status: DC
  Administered 2019-03-27 – 2019-03-28 (×2): 1 via RESPIRATORY_TRACT
  Filled 2019-03-25: qty 12

## 2019-03-25 MED ORDER — STERILE WATER FOR INJECTION IJ SOLN
INTRAMUSCULAR | Status: AC
  Filled 2019-03-25: qty 10

## 2019-03-25 MED ORDER — MONTELUKAST SODIUM 10 MG PO TABS
10.0000 mg | ORAL_TABLET | Freq: Every day | ORAL | Status: DC
  Administered 2019-03-27: 10 mg via ORAL
  Filled 2019-03-25 (×4): qty 1

## 2019-03-25 MED ORDER — ZIPRASIDONE MESYLATE 20 MG IM SOLR
20.0000 mg | Freq: Once | INTRAMUSCULAR | Status: AC
  Administered 2019-03-25: 20 mg via INTRAMUSCULAR
  Filled 2019-03-25: qty 20

## 2019-03-25 NOTE — ED Provider Notes (Signed)
[facility]-EMERGENCY DEPT Provider Note   CSN: [id number] Arrival date & time: 03/25/19  1356     History Chief Complaint  Patient presents with  . IVC  . Aggressive Behavior    Jeffrey Hall is a 24 y.o. male.  HPI   Patient presented to the ED for evaluation of aggressive and bizarre behavior.  Patient was initially found sleeping behind the wheel of the vehicle.  When police went to check on him he was very combative and aggressive.  Patient also told them that he wanted them to kill him.  He had bizarre thoughts and was brought to the ED for further evaluation.  Patient states he just wants to be left alone.  He wants to go to God and he wants to die.  Past Medical History:  Diagnosis Date  . Asthma   . Eczema   . Frequent headaches   . Tuberculosis   . Urticaria     Patient Active Problem List   Diagnosis Date Noted  . Hx of TB lung, latent 01/25/2018  . History of latent tuberculosis 11/02/2017  . MDD (major depressive disorder) 10/22/2017  . Heart palpitations 02/20/2017  . Myalgia 01/22/2017  . Fatigue 01/22/2017  . Generalized anxiety disorder 12/29/2016  . Allergic rhinitis 11/21/2016  . Constipation 11/21/2016  . Multiple somatic complaints 11/21/2016  . Premature ejaculation 04/21/2016  . Asthma, persistent not controlled 04/21/2016    Past Surgical History:  Procedure Laterality Date  . NO PAST SURGERIES         Family History  Problem Relation Age of Onset  . Cancer Neg Hx   . Heart disease Neg Hx   . Hypertension Neg Hx   . Hyperlipidemia Neg Hx   . Depression Neg Hx     Social History   Tobacco Use  . Smoking status: Never Smoker  . Smokeless tobacco: Never Used  Substance Use Topics  . Alcohol use: No  . Drug use: No    Home Medications Prior to Admission medications   Medication Sig Start Date End Date Taking? Authorizing Provider  DULoxetine (CYMBALTA) 60 MG capsule Take 2 capsules by mouth once  daily 01/07/19   Claybon Jabs, Teresa T, PA-C  fexofenadine (ALLEGRA) 180 MG tablet Take 1 tablet (180 mg total) by mouth daily. 10/10/17   Marcelyn Bruins, MD  fluticasone (FLOVENT HFA) 110 MCG/ACT inhaler INHALE 2 PUFFS BY MOUTH TWICE DAILY WITH  SPACER 05/06/18   Padgett, Pilar Grammes, MD  levocetirizine (XYZAL) 5 MG tablet TAKE 1 TABLET BY MOUTH ONCE DAILY EVERY EVENING 07/11/17   [provider]  lubiprostone (AMITIZA) 8 MCG capsule Take 1 capsule (8 mcg total) by mouth 2 (two) times daily with a meal. Patient not taking: Reported on 02/20/2018 01/30/18   [place], Betty G, MD  magnesium hydroxide (MILK OF MAGNESIA) 400 MG/5ML suspension Take by mouth as needed.     [provider]  montelukast (SINGULAIR) 10 MG tablet Take 1 tablet (10 mg total) by mouth at bedtime. 12/31/18   [place], Betty G, MD  Triamcinolone Acetonide (TRIAMCINOLONE 0.1 % CREAM : EUCERIN) CREA Apply 1 application topically 2 (two) times daily as needed for rash or itching. 02/22/18   [place], Betty G, MD  triamcinolone cream (KENALOG) 0.1 % Apply 1 application topically 2 (two) times daily. Up to 14 days at the time. 01/25/18   [place], Betty G, MD  Wheat Dextrin (BENEFIBER) POWD Take by mouth.    [provider]  Allergies    Patient has no known allergies.  Review of Systems   Review of Systems  All other systems reviewed and are negative.   Physical Exam Updated Vital Signs BP 138/74 (BP Location: Right Arm)   Pulse (!) 108   Temp 98 F (36.7 C) (Oral)   Resp (!) 22   SpO2 100%   Physical Exam Vitals and nursing note reviewed.  Constitutional:      Appearance: He is well-developed.  HENT:     Head: Normocephalic and atraumatic.     Right Ear: External ear normal.     Left Ear: External ear normal.  Eyes:     General: No scleral icterus.       Right eye: No discharge.        Left eye: No discharge.     Conjunctiva/sclera: Conjunctivae normal.  Neck:     Trachea: No  tracheal deviation.  Cardiovascular:     Rate and Rhythm: Normal rate and regular rhythm.  Pulmonary:     Effort: Pulmonary effort is normal. No respiratory distress.     Breath sounds: Normal breath sounds. No stridor. No wheezing or rales.  Abdominal:     General: Bowel sounds are normal. There is no distension.     Palpations: Abdomen is soft.     Tenderness: There is no abdominal tenderness. There is no guarding or rebound.  Musculoskeletal:        General: No tenderness.     Cervical back: Neck supple.  Skin:    General: Skin is warm and dry.     Findings: No rash.  Neurological:     Mental Status: He is alert.     Cranial Nerves: No cranial nerve deficit (no facial droop, extraocular movements intact, no slurred speech).     Sensory: No sensory deficit.     Motor: No abnormal muscle tone or seizure activity.     Coordination: Coordination normal.  Psychiatric:        Mood and Affect: Mood is anxious. Affect is labile and angry.        Speech: Speech is tangential.        Behavior: Behavior is agitated and aggressive.        Thought Content: Thought content does not include homicidal ideation.        Judgment: Judgment is impulsive and inappropriate.     ED Results / Procedures / Treatments   Labs (all labs ordered are listed, but only abnormal results are displayed) Labs Reviewed  RESPIRATORY PANEL BY RT PCR (FLU A&B, COVID)  COMPREHENSIVE METABOLIC PANEL  ETHANOL  RAPID URINE DRUG SCREEN, HOSP PERFORMED  CBC WITH DIFFERENTIAL/PLATELET    EKG None  Radiology No results found.  Procedures Procedures (including critical care time)  Medications Ordered in ED Medications  sterile water (preservative free) injection (has no administration in time range)  ziprasidone (GEODON) injection 20 mg (20 mg Intramuscular Given 03/25/19 1436)  LORazepam (ATIVAN) injection 2 mg (2 mg Intramuscular Given 03/25/19 1436)    ED Course  I have reviewed the triage vital signs  and the nursing notes.  Pertinent labs & imaging results that were available during my care of the patient were reviewed by me and considered in my medical decision making (see chart for details).    MDM Rules/Calculators/A&P                      Pt presents with acute agitation, bizarre psychotic  behavior.  Pt is under IVC.  Dr Sedonia Small to follow up on labs.                                                                                                                    Final Clinical Impression(s) / ED Diagnoses Final diagnoses:  Aggressive behavior  Psychosis, unspecified psychosis type [facility])      Dorie Rank, MD 03/25/19 [phone]

## 2019-03-25 NOTE — BH Assessment (Signed)
[facility] Assessment Progress Note   Clinician informed by RN Randa Evens that patient is in four point restraints now.  Pt was administered 20mg  Geodon and 2mg  Ativan IM at 14:36. TTS to check on patient later when pt is alert and oriented.

## 2019-03-25 NOTE — ED Triage Notes (Signed)
Patient brought in with multiple [employer] officers. Found sleeping behind the wheel, became combative. IVC'd.

## 2019-03-25 NOTE — ED Provider Notes (Signed)
  Provider Note MRN:  [id number]  Arrival date & time: 03/25/19    ED Course and Medical Decision Making  Assumed care from Dr. Lynelle Doctor at shift change.  Suspect acute psychosis, awaiting laboratory assessment and medical clearance and then will await TTS recommendations.  5:47 PM update: Medically cleared, signed out to default provider.  Procedures  Final Clinical Impressions(s) / ED Diagnoses     ICD-10-CM   1. Aggressive behavior  R46.89   2. Psychosis, unspecified psychosis type [facility])  F29     ED Discharge Orders    None      Discharge Instructions   None     Elmer Sow. Pilar Plate, MD [facility] [email or web address]    Sabas Sous, MD 03/25/19 [phone]

## 2019-03-25 NOTE — Progress Notes (Signed)
Received Jeffrey Hall this PM in 4 point restraints in his bed with the sitter at the bedside. Later he tried to use his mouth to free himself and stated he wanted  to go home. He eventually drifted back off to sleep and woke up at 2245 hrs. He was cooperative with VS check and asked for the restraints to be released. He gave his word he would not try to leave. The restraints were removed at 2245 hrs and he drifted back to sleep. He slept throughout the night without incident.

## 2019-03-26 LAB — RAPID URINE DRUG SCREEN, HOSP PERFORMED
Amphetamines: NOT DETECTED
Barbiturates: NOT DETECTED
Benzodiazepines: POSITIVE — AB
Cocaine: NOT DETECTED
Opiates: NOT DETECTED
Tetrahydrocannabinol: POSITIVE — AB

## 2019-03-26 MED ORDER — HALOPERIDOL 5 MG PO TABS: 5.0000 mg | ORAL_TABLET | Freq: Four times a day (QID) | ORAL | Status: DC | PRN

## 2019-03-26 MED ORDER — HALOPERIDOL LACTATE 5 MG/ML IJ SOLN
5.0000 mg | Freq: Four times a day (QID) | INTRAMUSCULAR | Status: DC | PRN
  Administered 2019-03-26 – 2019-03-27 (×2): 5 mg via INTRAMUSCULAR
  Filled 2019-03-26 (×3): qty 1

## 2019-03-26 MED ORDER — LORAZEPAM 1 MG PO TABS
2.0000 mg | ORAL_TABLET | Freq: Four times a day (QID) | ORAL | Status: DC | PRN
  Administered 2019-03-28: 2 mg via ORAL
  Filled 2019-03-26: qty 2

## 2019-03-26 MED ORDER — LORAZEPAM 2 MG/ML IJ SOLN
2.0000 mg | Freq: Four times a day (QID) | INTRAMUSCULAR | Status: DC | PRN
  Administered 2019-03-26 – 2019-03-27 (×2): 2 mg via INTRAMUSCULAR
  Filled 2019-03-26 (×2): qty 1

## 2019-03-26 NOTE — BH Assessment (Signed)
[facility] Assessment Progress Note   Clinician checked with Randa Evens, RN patient is still sleeping and cannot participate in TTS assessment.  TTS to check on patient at a later time to see if he can participate.

## 2019-03-26 NOTE — ED Notes (Signed)
Pt became agitated after he was told he was not leaving today.  Security was called and PRN was obtained.

## 2019-03-26 NOTE — Progress Notes (Signed)
Patient meets inpatient criteria per Berneice Heinrich, NP. Patient has been faxed out to the following facilities for review:   [facility]  [facility]  [facility]  [facility]  [facility]  [facility]  [facility]  [facility] Details [facility] Details [facility] Details [facility] Details [facility] Details [facility] Details [facility] Details [facility]  CSW will continue to follow and assist with disposition planning.   Drucilla Schmidt, MSW, LCSW-A Clinical Disposition Social Worker [facility]/TTS [phone]

## 2019-03-26 NOTE — BH Assessment (Signed)
Clinician contacted pt's nurse, Dominga Ferry, to inquire about completing pt's BH Assessment. Clinician was updated by pt's nurse that pt is still asleep and unable to participate in his BH Assessment at this time. TTS will contact pt's nurse at a later time to inquire if pt is awake and able to complete his assessment.

## 2019-03-26 NOTE — ED Provider Notes (Signed)
Emergency Medicine Observation Re-evaluation Note  Jeffrey Hall is a 24 y.o. male, seen on rounds today.  Pt initially presented to the ED for complaints of IVC and Aggressive Behavior PT required prn meds for agitation. Sedation has delayed TTS eval. Per psychiatry team, pt will require inpatient treatment.  Physical Exam  BP 112/87 (BP Location: Left Arm)   Pulse (!) 118   Temp 98.4 F (36.9 C) (Oral)   Resp 18   SpO2 98%  Physical Exam  Gen: asleep under blanket, snoring Pulm: normal WOB, no respiratory distress Psych: calm, asleep, no agitation MSK: no injury, no edema  ED Course / MDM  EKG:    I have reviewed the labs performed to date as well as medications administered while in observation.   Plan  Current plan is for pt to be transferred to [facility] when bed is ready. Patient is under full IVC at this time.   Jeffrey Hall, Ambrose Finland, MD 03/26/19 [phone]

## 2019-03-26 NOTE — Consult Note (Signed)
[facility] Psych ED Progress Note  03/26/2019 4:24 PM Jeffrey Hall  MRN:  [id number] Subjective: Patient assessed by nurse practitioner.  Patient alert and oriented.  Patient observed lying on hospital stretcher. Patient appears reluctant to participate in evaluation, patient appears paranoid. Patient states "who trains you, I have rules to if you asked me questions I will ask you questions to it." Patient presents with bizarre conversation.  Patient states "I know you are trying to do your job but I do not know you and you do not know me. I have a policy." Patient denies suicidal and homicidal ideations, patient denies auditory and visual hallucinations. Patient denies alcohol use, patient states when asked regarding substance "that is my business."  Patient's urine drug screen positive for benzodiazepines as well as marijuana. Patient refuses to provide information to allow any collection of collateral information.  Patient reports "I am from [place]." At approximately 1400 patient request to speak with this Clinical research associate.  Spoke with patient on the phone who states "I want to go home if I cannot go home you will be responsible for my behavior."  Patient offered support and encouragement.  Principal Problem: <principal problem not specified> Diagnosis:  Active Problems:   Generalized anxiety disorder   MDD (major depressive disorder)  Total Time spent with patient: 30 minutes  Past Psychiatric History: Anxiety, depression  Past Medical History:  Past Medical History:  Diagnosis Date  . Asthma   . Eczema   . Frequent headaches   . Tuberculosis   . Urticaria     Past Surgical History:  Procedure Laterality Date  . NO PAST SURGERIES     Family History:  Family History  Problem Relation Age of Onset  . Cancer Neg Hx   . Heart disease Neg Hx   . Hypertension Neg Hx   . Hyperlipidemia Neg Hx   . Depression Neg Hx    Family Psychiatric  History: Unknown Social History:  Social  History   Substance and Sexual Activity  Alcohol Use No     Social History   Substance and Sexual Activity  Drug Use No    Social History   Socioeconomic History  . Marital status: Single    Spouse name: Not on file  . Number of children: Not on file  . Years of education: Not on file  . Highest education level: Not on file  Occupational History  . Occupation: STUDENT     Comment: [employer]  Tobacco Use  . Smoking status: Never Smoker  . Smokeless tobacco: Never Used  Substance and Sexual Activity  . Alcohol use: No  . Drug use: No  . Sexual activity: Not Currently  Other Topics Concern  . Not on file  Social History Narrative  . Not on file   Social Determinants of Health   Financial Resource Strain:   . Difficulty of Paying Living Expenses: Not on file  Food Insecurity:   . Worried About Programme researcher, broadcasting/film/video in the Last Year: Not on file  . Ran Out of Food in the Last Year: Not on file  Transportation Needs:   . Lack of Transportation (Medical): Not on file  . Lack of Transportation (Non-Medical): Not on file  Physical Activity:   . Days of Exercise per Week: Not on file  . Minutes of Exercise per Session: Not on file  Stress:   . Feeling of Stress : Not on file  Social Connections:   . Frequency of Communication with  Friends and Family: Not on file  . Frequency of Social Gatherings with Friends and Family: Not on file  . Attends Religious Services: Not on file  . Active Member of Clubs or Organizations: Not on file  . Attends BankerClub or Organization Meetings: Not on file  . Marital Status: Not on file    Sleep: Good  Appetite:  Good  Current Medications: Current Facility-Administered Medications  Medication Dose Route Frequency Provider Last Rate Last Admin  . fluticasone (FLOVENT HFA) 110 MCG/ACT inhaler 1 puff  1 puff Inhalation BID Linwood DibblesKnapp, Jon, MD      . haloperidol (HALDOL) tablet 5 mg  5 mg Oral Q6H PRN Patrcia Dollyate, Tina L, FNP       Or  . haloperidol lactate  (HALDOL) injection 5 mg  5 mg Intramuscular Q6H PRN Patrcia Dollyate, Tina L, FNP   5 mg at 03/26/19 1406  . loratadine (CLARITIN) tablet 10 mg  10 mg Oral Daily Linwood DibblesKnapp, Jon, MD      . LORazepam (ATIVAN) tablet 2 mg  2 mg Oral Q6H PRN Patrcia Dollyate, Tina L, FNP       Or  . LORazepam (ATIVAN) injection 2 mg  2 mg Intramuscular Q6H PRN Patrcia Dollyate, Tina L, FNP   2 mg at 03/26/19 1405  . montelukast (SINGULAIR) tablet 10 mg  10 mg Oral QHS Linwood DibblesKnapp, Jon, MD       Current Outpatient Medications  Medication Sig Dispense Refill  . DULoxetine (CYMBALTA) 60 MG capsule Take 2 capsules by mouth once daily (Patient not taking: Reported on 03/26/2019) 180 capsule 0  . fexofenadine (ALLEGRA) 180 MG tablet Take 1 tablet (180 mg total) by mouth daily. (Patient not taking: Reported on 03/26/2019) 90 tablet 1  . fluticasone (FLOVENT HFA) 110 MCG/ACT inhaler INHALE 2 PUFFS BY MOUTH TWICE DAILY WITH  SPACER (Patient not taking: Reported on 03/26/2019) 36 g 2  . lubiprostone (AMITIZA) 8 MCG capsule Take 1 capsule (8 mcg total) by mouth 2 (two) times daily with a meal. (Patient not taking: Reported on 02/20/2018) 60 capsule 3  . montelukast (SINGULAIR) 10 MG tablet Take 1 tablet (10 mg total) by mouth at bedtime. (Patient not taking: Reported on 03/26/2019) 90 tablet 0  . Triamcinolone Acetonide (TRIAMCINOLONE 0.1 % CREAM : EUCERIN) CREA Apply 1 application topically 2 (two) times daily as needed for rash or itching. (Patient not taking: Reported on 03/26/2019) 500 each 1  . triamcinolone cream (KENALOG) 0.1 % Apply 1 application topically 2 (two) times daily. Up to 14 days at the time. (Patient not taking: Reported on 03/26/2019) 30 g 1    Lab Results:  Results for orders placed or performed during the hospital encounter of 03/25/19 (from the past 48 hour(s))  Respiratory Panel by RT PCR (Flu A&B, Covid) - Nasopharyngeal Swab     Status: None   Collection Time: 03/25/19  2:32 PM   Specimen: Nasopharyngeal Swab  Result Value Ref Range   SARS  Coronavirus 2 by RT PCR NEGATIVE NEGATIVE    Comment: (NOTE) SARS-CoV-2 target nucleic acids are NOT DETECTED. The SARS-CoV-2 RNA is generally detectable in upper respiratoy specimens during the acute phase of infection. The lowest concentration of SARS-CoV-2 viral copies this assay can detect is 131 copies/mL. A negative result does not preclude SARS-Cov-2 infection and should not be used as the sole basis for treatment or other patient management decisions. A negative result may occur with  improper specimen collection/handling, submission of specimen other than nasopharyngeal swab, presence of  viral mutation(s) within the areas targeted by this assay, and inadequate number of viral copies (<131 copies/mL). A negative result must be combined with clinical observations, patient history, and epidemiological information. The expected result is Negative. Fact Sheet for Patients:  PinkCheek.be Fact Sheet for Healthcare Providers:  GravelBags.it This test is not yet ap proved or cleared by the [place] FDA and  has been authorized for detection and/or diagnosis of SARS-CoV-2 by FDA under an Emergency Use Authorization (EUA). This EUA will remain  in effect (meaning this test can be used) for the duration of the COVID-19 declaration under Section 564(b)(1) of the Act, 21 U.S.C. section 360bbb-3(b)(1), unless the authorization is terminated or revoked sooner.    Influenza A by PCR NEGATIVE NEGATIVE   Influenza B by PCR NEGATIVE NEGATIVE    Comment: (NOTE) The Xpert Xpress SARS-CoV-2/FLU/RSV assay is intended as an aid in  the diagnosis of influenza from Nasopharyngeal swab specimens and  should not be used as a sole basis for treatment. Nasal washings and  aspirates are unacceptable for Xpert Xpress SARS-CoV-2/FLU/RSV  testing. Fact Sheet for Patients: PinkCheek.be Fact Sheet for Healthcare  Providers: GravelBags.it This test is not yet approved or cleared by the [place] FDA and  has been authorized for detection and/or diagnosis of SARS-CoV-2 by  FDA under an Emergency Use Authorization (EUA). This EUA will remain  in effect (meaning this test can be used) for the duration of the  Covid-19 declaration under Section 564(b)(1) of the Act, 21  U.S.C. section 360bbb-3(b)(1), unless the authorization is  terminated or revoked. Performed at [facility], [place] [address], [place], [place] [address]   Comprehensive metabolic panel     Status: Abnormal   Collection Time: 03/25/19  2:32 PM  Result Value Ref Range   Sodium 139 135 - 145 mmol/L   Potassium 3.1 (L) 3.5 - 5.1 mmol/L   Chloride 105 98 - 111 mmol/L   CO2 22 22 - 32 mmol/L   Glucose, Bld 105 (H) 70 - 99 mg/dL    Comment: Glucose reference range applies only to samples taken after fasting for at least 8 hours.   BUN 13 6 - 20 mg/dL   Creatinine, Ser 1.04 0.61 - 1.24 mg/dL   Calcium 9.2 8.9 - 10.3 mg/dL   Total Protein 7.5 6.5 - 8.1 g/dL   Albumin 4.3 3.5 - 5.0 g/dL   AST 29 15 - 41 U/L   ALT 24 0 - 44 U/L   Alkaline Phosphatase 80 38 - 126 U/L   Total Bilirubin 0.8 0.3 - 1.2 mg/dL   GFR calc non Af Amer >60 >60 mL/min   GFR calc Af Amer >60 >60 mL/min   Anion gap 12 5 - 15    Comment: Performed at [facility], [place] [address] [place], [place] [address]  Ethanol     Status: None   Collection Time: 03/25/19  2:32 PM  Result Value Ref Range   Alcohol, Ethyl (B) <10 <10 mg/dL    Comment: (NOTE) Lowest detectable limit for serum alcohol is 10 mg/dL. For medical purposes only. Performed at [facility], [place] [address], [place], [place] [address]   CBC with Diff     Status: Abnormal   Collection Time: 03/25/19  2:32 PM  Result Value Ref Range   WBC 10.5 4.0 - 10.5 K/uL   RBC 5.19 4.22 - 5.81 MIL/uL   Hemoglobin 15.1 13.0 -  17.0 g/dL  HCT 45.4 39.0 - 52.0 %   MCV 87.5 80.0 - 100.0 fL   MCH 29.1 26.0 - 34.0 pg   MCHC 33.3 30.0 - 36.0 g/dL   RDW 32.6 71.2 - 45.8 %   Platelets 224 150 - 400 K/uL   nRBC 0.0 0.0 - 0.2 %   Neutrophils Relative % 81 %   Neutro Abs 8.5 (H) 1.7 - 7.7 K/uL   Lymphocytes Relative 10 %   Lymphs Abs 1.0 0.7 - 4.0 K/uL   Monocytes Relative 8 %   Monocytes Absolute 0.8 0.1 - 1.0 K/uL   Eosinophils Relative 1 %   Eosinophils Absolute 0.1 0.0 - 0.5 K/uL   Basophils Relative 0 %   Basophils Absolute 0.0 0.0 - 0.1 K/uL   Immature Granulocytes 0 %   Abs Immature Granulocytes 0.03 0.00 - 0.07 K/uL    Comment: Performed at [facility], [address] [place], [place] [address]  Urine rapid drug screen (hosp performed)     Status: Abnormal   Collection Time: 03/26/19  1:01 PM  Result Value Ref Range   Opiates NONE DETECTED NONE DETECTED   Cocaine NONE DETECTED NONE DETECTED   Benzodiazepines POSITIVE (A) NONE DETECTED   Amphetamines NONE DETECTED NONE DETECTED   Tetrahydrocannabinol POSITIVE (A) NONE DETECTED   Barbiturates NONE DETECTED NONE DETECTED    Comment: (NOTE) DRUG SCREEN FOR MEDICAL PURPOSES ONLY.  IF CONFIRMATION IS NEEDED FOR ANY PURPOSE, NOTIFY LAB WITHIN 5 DAYS. LOWEST DETECTABLE LIMITS FOR URINE DRUG SCREEN Drug Class                     Cutoff (ng/mL) Amphetamine and metabolites    1000 Barbiturate and metabolites    200 Benzodiazepine                 200 Tricyclics and metabolites     300 Opiates and metabolites        300 Cocaine and metabolites        300 THC                            50 Performed at [facility], [address], [place], [place] [address]     Blood Alcohol level:  Lab Results  Component Value Date   ETH <10 03/25/2019    Physical Findings: AIMS:  , ,  ,  ,    CIWA:    COWS:     Musculoskeletal: Strength & Muscle Tone: within normal limits Gait & Station: normal Patient leans:  N/A  Psychiatric Specialty Exam: Physical Exam Vitals and nursing note reviewed.  Constitutional:      Appearance: He is well-developed.  HENT:     Head: Normocephalic.  Cardiovascular:     Rate and Rhythm: Normal rate.  Pulmonary:     Effort: Pulmonary effort is normal.  Neurological:     Mental Status: He is alert and oriented to person, place, and time.  Psychiatric:        Attention and Perception: Attention normal.        Mood and Affect: Mood is anxious. Affect is labile.        Speech: Speech is tangential.        Behavior: Behavior is cooperative.        Thought Content: Thought content is paranoid.        Cognition and Memory: Cognition normal.  Judgment: Judgment is impulsive.     Review of Systems  Constitutional: Negative.   HENT: Negative.   Eyes: Negative.   Respiratory: Negative.   Cardiovascular: Negative.   Gastrointestinal: Negative.   Genitourinary: Negative.   Musculoskeletal: Negative.   Skin: Negative.   Neurological: Negative.   Psychiatric/Behavioral: Positive for agitation. The patient is nervous/anxious.     Blood pressure 112/87, pulse (!) 118, temperature 98.4 F (36.9 C), temperature source Oral, resp. rate 18, SpO2 98 %.There is no height or weight on file to calculate BMI.  General Appearance: Casual  Eye Contact:  Fair  Speech:  Clear and Coherent  Volume:  Increased  Mood:  Anxious  Affect:  Labile  Thought Process:  Disorganized and Descriptions of Associations: Tangential  Orientation:  Full (Time, Place, and Person)  Thought Content:  Paranoid Ideation  Suicidal Thoughts:  No  Homicidal Thoughts:  No  Memory:  Immediate;   Good Recent;   Good Remote;   Good  Judgement:  Impaired  Insight:  Lacking  Psychomotor Activity:  Normal  Concentration:  Concentration: Good and Attention Span: Good  Recall:  Good  Fund of Knowledge:  Good  Language:  Good  Akathisia:  No  Handed:  Right  AIMS (if indicated):     Assets:   Communication Skills  ADL's:  Intact  Cognition:  WNL  Sleep:         Treatment Plan Summary: Case discussed with Dr. Jola Babinski. Daily contact with patient to assess and evaluate symptoms and progress in treatment and Medication management Haldol 5 mg every 6 as needed agitation and Ativan 2 mg every 6 as needed agitation initiated. Inpatient psychiatric treatment recommended.  Patrcia Dolly, FNP 03/26/2019, 4:24 PM

## 2019-03-26 NOTE — ED Notes (Signed)
Pt arrived to unit calm and cooperative.  Pt was oriented to room and unit.  15 minute checks and video monitoring in place. 

## 2019-03-27 MED ORDER — BENZTROPINE MESYLATE 0.5 MG PO TABS
0.5000 mg | ORAL_TABLET | Freq: Two times a day (BID) | ORAL | Status: DC
  Administered 2019-03-27 – 2019-03-28 (×2): 0.5 mg via ORAL
  Filled 2019-03-27 (×2): qty 1

## 2019-03-27 MED ORDER — HALOPERIDOL 5 MG PO TABS
10.0000 mg | ORAL_TABLET | Freq: Two times a day (BID) | ORAL | Status: DC
  Administered 2019-03-27 – 2019-03-28 (×2): 10 mg via ORAL
  Filled 2019-03-27 (×2): qty 2

## 2019-03-27 MED ORDER — HALOPERIDOL LACTATE 5 MG/ML IJ SOLN: 10.0000 mg | Freq: Two times a day (BID) | INTRAMUSCULAR | Status: DC

## 2019-03-27 NOTE — ED Notes (Addendum)
Pt became agitated, wanting to leave.  Pt yelling ,  Pulled soft velcro barthroom doors off wall. pt difficult to redirect,  security called.  Pt escorted to room by security PRN Ativan 2mg  IM given PRN Haldol 5 mg IM given PRNs effective, pt resting comfortably in bed.

## 2019-03-27 NOTE — ED Notes (Signed)
Pt awake. Pt drowsy, stated he was tired. Pt given water. Pt calm, cooperative.

## 2019-03-27 NOTE — Progress Notes (Signed)
Received Jeffrey Hall this PM in his room asleep, he woke up on his own and asked to shower. Later he received a snack of his choice and was compliant with his nighttime medications. He has been calm and cooperative. He slept throughout the night.

## 2019-03-27 NOTE — BH Assessment (Signed)
[facility] Assessment Progress Note  Per Berneice Heinrich, FNP, this pt continues to require psychiatric hospitalization at this time.  Pt remains under IVC initiated by law enforcement on 03/25/2019, and upheld by Landry Mellow, MD.  The following facilities have been contacted to seek placement for this pt, with results as noted:  Beds available, information sent, decision pending: [facility] Sharyne Richters  Unable to reach: [employer] (left message at [phone]) Mikey Bussing (left message at [phone]) Mannie Stabile (left message at [phone])  At capacity: [facility] (adult unit under quarantine) [facility]   [place], [place] [facility] Coordinator [phone]

## 2019-03-27 NOTE — Progress Notes (Signed)
Received Jeffrey Hall t his PM in his room asleep in the bed. He was awaken unexpectedly, used the bathroom and made a telephone call.  He returned to his room and drifted off to sleep without incident and slept throughout the night. He verbalized his concerned related to his length of stay at the hospital.

## 2019-03-27 NOTE — Consult Note (Signed)
[facility] Psych ED Progress Note  03/27/2019 2:56 PM Quency Tober  MRN:  [id number] Subjective: Patient states "I do not want to go back to philosophy I want to talk to you like a human." Patient assessed by nurse practitioner.  Patient alert and oriented, appears to have bizarre affect.  Patient denies suicidal and homicidal ideations.  Patient denies auditory and visual hallucinations. Patient reports slept well. Patient states prior to arrival "I was talking to my God." Patient denies any collection of collateral information states "that will never be possible, I do not trust anybody." Irritable and labile.  Patient demands discharge, states "if you do not let me leave you will be responsible for my behavior."  Principal Problem: <principal problem not specified> Diagnosis:  Active Problems:   Generalized anxiety disorder   MDD (major depressive disorder)  Total Time spent with patient: 20 minutes  Past Psychiatric History: Generalized anxiety disorder, major depressive disorder  Past Medical History:  Past Medical History:  Diagnosis Date  . Asthma   . Eczema   . Frequent headaches   . Tuberculosis   . Urticaria     Past Surgical History:  Procedure Laterality Date  . NO PAST SURGERIES     Family History:  Family History  Problem Relation Age of Onset  . Cancer Neg Hx   . Heart disease Neg Hx   . Hypertension Neg Hx   . Hyperlipidemia Neg Hx   . Depression Neg Hx    Family Psychiatric  History: Unknown Social History:  Social History   Substance and Sexual Activity  Alcohol Use No     Social History   Substance and Sexual Activity  Drug Use No    Social History   Socioeconomic History  . Marital status: Single    Spouse name: Not on file  . Number of children: Not on file  . Years of education: Not on file  . Highest education level: Not on file  Occupational History  . Occupation: STUDENT     Comment: [employer]  Tobacco Use  . Smoking status: Never  Smoker  . Smokeless tobacco: Never Used  Substance and Sexual Activity  . Alcohol use: No  . Drug use: No  . Sexual activity: Not Currently  Other Topics Concern  . Not on file  Social History Narrative  . Not on file   Social Determinants of Health   Financial Resource Strain:   . Difficulty of Paying Living Expenses:   Food Insecurity:   . Worried About Charity fundraiser in the Last Year:   . Arboriculturist in the Last Year:   Transportation Needs:   . Film/video editor (Medical):   Marland Kitchen Lack of Transportation (Non-Medical):   Physical Activity:   . Days of Exercise per Week:   . Minutes of Exercise per Session:   Stress:   . Feeling of Stress :   Social Connections:   . Frequency of Communication with Friends and Family:   . Frequency of Social Gatherings with Friends and Family:   . Attends Religious Services:   . Active Member of Clubs or Organizations:   . Attends Archivist Meetings:   Marland Kitchen Marital Status:     Sleep: Good  Appetite:  Fair  Current Medications: Current Facility-Administered Medications  Medication Dose Route Frequency Provider Last Rate Last Admin  . fluticasone (FLOVENT HFA) 110 MCG/ACT inhaler 1 puff  1 puff Inhalation BID Dorie Rank, MD      .  haloperidol (HALDOL) tablet 5 mg  5 mg Oral Q6H PRN Patrcia Dolly, FNP       Or  . haloperidol lactate (HALDOL) injection 5 mg  5 mg Intramuscular Q6H PRN Patrcia Dolly, FNP   5 mg at 03/27/19 1032  . loratadine (CLARITIN) tablet 10 mg  10 mg Oral Daily Linwood Dibbles, MD      . LORazepam (ATIVAN) tablet 2 mg  2 mg Oral Q6H PRN Patrcia Dolly, FNP       Or  . LORazepam (ATIVAN) injection 2 mg  2 mg Intramuscular Q6H PRN Patrcia Dolly, FNP   2 mg at 03/27/19 1032  . montelukast (SINGULAIR) tablet 10 mg  10 mg Oral QHS Linwood Dibbles, MD       Current Outpatient Medications  Medication Sig Dispense Refill  . DULoxetine (CYMBALTA) 60 MG capsule Take 2 capsules by mouth once daily (Patient not taking:  Reported on 03/26/2019) 180 capsule 0  . fexofenadine (ALLEGRA) 180 MG tablet Take 1 tablet (180 mg total) by mouth daily. (Patient not taking: Reported on 03/26/2019) 90 tablet 1  . fluticasone (FLOVENT HFA) 110 MCG/ACT inhaler INHALE 2 PUFFS BY MOUTH TWICE DAILY WITH  SPACER (Patient not taking: Reported on 03/26/2019) 36 g 2  . lubiprostone (AMITIZA) 8 MCG capsule Take 1 capsule (8 mcg total) by mouth 2 (two) times daily with a meal. (Patient not taking: Reported on 02/20/2018) 60 capsule 3  . montelukast (SINGULAIR) 10 MG tablet Take 1 tablet (10 mg total) by mouth at bedtime. (Patient not taking: Reported on 03/26/2019) 90 tablet 0  . Triamcinolone Acetonide (TRIAMCINOLONE 0.1 % CREAM : EUCERIN) CREA Apply 1 application topically 2 (two) times daily as needed for rash or itching. (Patient not taking: Reported on 03/26/2019) 500 each 1  . triamcinolone cream (KENALOG) 0.1 % Apply 1 application topically 2 (two) times daily. Up to 14 days at the time. (Patient not taking: Reported on 03/26/2019) 30 g 1    Lab Results:  Results for orders placed or performed during the hospital encounter of 03/25/19 (from the past 48 hour(s))  Urine rapid drug screen (hosp performed)     Status: Abnormal   Collection Time: 03/26/19  1:01 PM  Result Value Ref Range   Opiates NONE DETECTED NONE DETECTED   Cocaine NONE DETECTED NONE DETECTED   Benzodiazepines POSITIVE (A) NONE DETECTED   Amphetamines NONE DETECTED NONE DETECTED   Tetrahydrocannabinol POSITIVE (A) NONE DETECTED   Barbiturates NONE DETECTED NONE DETECTED    Comment: (NOTE) DRUG SCREEN FOR MEDICAL PURPOSES ONLY.  IF CONFIRMATION IS NEEDED FOR ANY PURPOSE, NOTIFY LAB WITHIN 5 DAYS. LOWEST DETECTABLE LIMITS FOR URINE DRUG SCREEN Drug Class                     Cutoff (ng/mL) Amphetamine and metabolites    1000 Barbiturate and metabolites    200 Benzodiazepine                 200 Tricyclics and metabolites     300 Opiates and metabolites         300 Cocaine and metabolites        300 THC                            50 Performed at [facility], [address], [place], [place] [address]     Blood Alcohol level:  Lab Results  Component Value Date   ETH <10 03/25/2019    Physical Findings: AIMS:  , ,  ,  ,    CIWA:    COWS:     Musculoskeletal: Strength & Muscle Tone: within normal limits Gait & Station: normal Patient leans: N/A  Psychiatric Specialty Exam: Physical Exam Vitals and nursing note reviewed.  Constitutional:      Appearance: He is well-developed.  HENT:     Head: Normocephalic.  Cardiovascular:     Rate and Rhythm: Normal rate.  Pulmonary:     Effort: Pulmonary effort is normal.  Neurological:     Mental Status: He is alert and oriented to person, place, and time.  Psychiatric:        Attention and Perception: Attention normal.        Mood and Affect: Mood is anxious. Affect is labile.        Speech: Speech is tangential.        Behavior: Behavior is cooperative.        Thought Content: Thought content is paranoid.        Cognition and Memory: Cognition normal.        Judgment: Judgment is impulsive.     Review of Systems  Constitutional: Negative.   HENT: Negative.   Eyes: Negative.   Respiratory: Negative.   Cardiovascular: Negative.   Gastrointestinal: Negative.   Genitourinary: Negative.   Musculoskeletal: Negative.   Skin: Negative.   Neurological: Negative.   Psychiatric/Behavioral: The patient is nervous/anxious.     Blood pressure 137/80, pulse 94, temperature 98.6 F (37 C), temperature source Oral, resp. rate 18, SpO2 100 %.There is no height or weight on file to calculate BMI.  General Appearance: Fairly Groomed  Eye Contact:  Fair  Speech:  Clear and Coherent  Volume:  Increased  Mood:  Irritable  Affect:  Labile  Thought Process:  Disorganized and Descriptions of Associations: Tangential  Orientation:  Full (Time, Place, and Person)  Thought  Content:  Paranoid Ideation  Suicidal Thoughts:  No  Homicidal Thoughts:  No  Memory:  Immediate;   Good Recent;   Good Remote;   Good  Judgement:  Impaired  Insight:  Lacking  Psychomotor Activity:  Normal  Concentration:  Concentration: Good and Attention Span: Good  Recall:  Good  Fund of Knowledge:  Good  Language:  Good  Akathisia:  No  Handed:  Right  AIMS (if indicated):     Assets:  Communication Skills Physical Health Talents/Skills Transportation Vocational/Educational  ADL's:  Intact  Cognition:  WNL  Sleep:         Treatment Plan Summary: Daily contact with patient to assess and evaluate symptoms and progress in treatment  Case discussed with Dr Jola Babinski.  Medication management: Increased Haldol to 10mg  PO BID, initiate Cogentin 0.5mg  PO BID.   , FNP 03/27/2019, 2:56 PM

## 2019-03-27 NOTE — ED Notes (Signed)
Pt asleep. Will take vitals after pt wakes.

## 2019-03-28 DIAGNOSIS — F411 Generalized anxiety disorder: Secondary | ICD-10-CM

## 2019-03-28 DIAGNOSIS — F12222 Cannabis dependence with intoxication with perceptual disturbance: Secondary | ICD-10-CM

## 2019-03-28 DIAGNOSIS — F3341 Major depressive disorder, recurrent, in partial remission: Secondary | ICD-10-CM

## 2019-03-28 MED ORDER — DULOXETINE HCL 30 MG PO CPEP
60.0000 mg | ORAL_CAPSULE | Freq: Every day | ORAL | Status: DC
  Administered 2019-03-28: 60 mg via ORAL
  Filled 2019-03-28: qty 2

## 2019-03-28 MED ORDER — QUETIAPINE FUMARATE 50 MG PO TABS: 50.0000 mg | ORAL_TABLET | Freq: Every day | ORAL | Status: DC

## 2019-03-28 NOTE — Discharge Summary (Signed)
  Patient seen by Dr. Jola Babinski. Patient to be discharged at this time. Please continue to take medications as directed. If your symptoms return, worsen, or persist please call your 911, report to local ER, or contact crisis hotline. Please do not drink alcohol or use any illegal substances while taking prescription medications.

## 2019-03-28 NOTE — ED Provider Notes (Signed)
Emergency Medicine Observation Re-evaluation Note  Jeffrey Hall is a 24 y.o. male, seen on rounds today.  Pt initially presented to the ED for complaints of IVC and Aggressive Behavior Currently, the patient is medically cleared.  Physical Exam  BP 128/72 (BP Location: Left Arm)   Pulse 96   Temp 98.5 F (36.9 C) (Oral)   Resp 18   SpO2 100%  Physical Exam  ED Course / MDM  EKG:    I have reviewed the labs performed to date as well as medications administered while in observation.  Recent changes in the last 24 hours include sedation for aggitation. Plan  Current plan is for inpatient bed search. Patient is under full IVC at this time.   Terrilee Files, MD 03/28/19 1719

## 2019-03-28 NOTE — Consult Note (Signed)
  Patient is seen and examined.  Patient is a 24 year old male from [place] who was brought to the emergency department at [facility] emergency department on 03/25/2018.  He had been noted to be acting bizarre and agitated.  He had been found sleeping behind the wheel of a car.  He was aggressive and combative.  He required intramuscular medication.  He stated he was talking to God and trying to make a decision on what to do.  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  His family is in [place] still.  He has been seen as an outpatient psychiatry patient.  His primary care provider referred him to [facility] psychiatric service, and he was taking Cymbalta 60 mg either once or twice a day.  He stated he last had the Cymbalta a week ago.  Review of the electronic medical record revealed that he had been diagnosed previously with major depressive disorder.  His last visit on 02/20/2018 was that he had been doing well.  There is no evidence of any psychosis, a prescription for any psychotic medications, or any evidence of bipolar mania.  He denied any auditory or visual hallucinations.  He is mildly hyper religious.  He denied any suicidal or homicidal ideation.  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  He denied previous psychiatric hospitalizations either in the [place] or  [place].  He denied having previously required antipsychotic medications or mood stabilizers.  Assessment and plan: #1 major depression, recurrent, #2 generalized anxiety disorder, #3 cannabis use disorder, #4 substance-induced psychotic behavior  Patient is seen in the emergency department face-to-face at [facility] emergency department.  The patient is rational, discussed his past psychiatric history rationally, denies auditory or visual hallucinations.  Denies suicidal or homicidal ideation.  I have a feeling that this is mainly substance-induced.  His drug screen on admission was positive for marijuana and benzodiazepines, he most likely  receive the benzodiazepines in the field.  The rest of his laboratories were essentially normal.  He is not suicidal or homicidal at this time.  He is not psychotic but mildly hyper religious.  We will discharge him home restarting his Cymbalta at 60 mg p.o. daily, and also starting Seroquel 50 mg p.o. nightly.  He is also instructed not to use any marijuana.  We will refer him back to his psychiatric provider at [facility].  He is instructed to return to the emergency department should he develop any recurrence of symptoms.

## 2019-03-28 NOTE — Discharge Instructions (Signed)
For your behavioral health needs, you are advised to continue treatment with your current provider at [facility]:       [facility]      [address]      [place], [place] [address]      [phone]  If for any reason you are unable to continue with their service, contact [facility] to schedule an intake appointment with them:       [facility]      [address]      [place], [place] [address]      [phone]      Crisis number: [phone]

## 2019-03-28 NOTE — ED Notes (Signed)
TWO BAGS OF BELONGINGS MOVED INTO LOCKER 29

## 2019-03-28 NOTE — ED Notes (Signed)
Pt discharged home. Discharged instructions read to pt who verbalized understanding. All belongings returned to pt who signed for same. Denies SI/HI, is not delusional and not responding to internal stimuli. Escorted pt to the ED exit.    

## 2019-03-28 NOTE — BH Assessment (Signed)
[facility] Assessment Progress Note  Per Landry Mellow, MD, this pt does not require psychiatric hospitalization at this time.  Pt presents under IVC initiated by law enforcement and initially upheld by Dr Jola Babinski, but now rescinded by him.  Pt is to be discharged from [facility] with recommendation to continue treatment with his current provider at [facility], or as a back up plan, with [facility].  This has been included in pt's discharge instructions.  Pt's nurse has been notified.  Doylene Canning, MA Triage Specialist [phone]

## 2019-03-28 NOTE — ED Notes (Signed)
Cooperative, not unpleasant. Anxious and restless.  Took PRN po medication for anxiety without difficulty.  Wanted to know what it is for this writer said, "anxiety" and he agreed that he is anxious. Would like to leave. Brings notes up the the nurse station that say, "I am honestly tired of being here", "I appreciate your help. I need help from my people if you can't help me. I will not talk if you can't help me."

## 2019-03-28 NOTE — Consult Note (Signed)
[facility] Psych ED Discharge  03/28/2019 11:59 AM Jeffrey Hall  MRN:  [id number] Principal Problem: <principal problem not specified> Discharge Diagnoses: Active Problems:   Generalized anxiety disorder   MDD (major depressive disorder)   Subjective: Patient is seen and examined. Patient is a 24 year old African male who originally presented to the emergency room at [facility] emergency department on 03/25/2019. The patient was found by police sleeping behind the wheel of the vehicle. When police went to check on him he became combative and aggressive. There is also mention that he wanted to "kill them". He was noted to have bizarre thoughts and was brought to the emergency department for evaluation. 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 He was trying to find out whether or not he should go one place or another. When he got to the emergency department he required intramuscular medication to calm him down. He received Geodon which was effective and he slept significantly. He had intermittent periods of irritability during the course of the emergency room stay. On discussion today he stated that he has been upset because no one will answer his questions. I sat down with him and spoke to him and he was very clear. He is from [place] in [place]. He has no social support here from his family. All of his family is in [place]. He stays with roommates. 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 He has a history of depression and anxiety and has been seen in the pas at [facility] psychiatry. He has previously taken Cymbalta up to 60 mg p.o. twice daily. The last note from that psychiatric provider was in February 2020. In the notes there is no mention of any history of psychosis, bipolar disorder, substance abuse. The patient did admit today that he has been smoking marijuana, but denied other substances. He is  mildly hyper religious, but does not have auditory or visual hallucinations, and denied any suicidal or homicidal ideation. He told me that he had last had his Cymbalta approximately 2 weeks ago. After evaluation is found not to be psychotic, not homicidal, not suicidal, no auditory visual hallucinations. He does not fulfill criteria for forced psychiatric admission. I will write prescriptions for Cymbalta, and give him Seroquel 50 mg p.o. nightly for his hyper religiosity just in case. I have also requested that he give up the marijuana use. We will request that he contact [facility] for a follow-up psychiatric appointment in 7 to 10 days.  Total Time spent with patient: 30 minutes  Past Psychiatric History: He has a past psychiatric history significant for anxiety and depression. It looks as though the only antidepressant he has been prescribed was cymbalta.  Past Medical History:  Past Medical History:  Diagnosis Date  . Asthma   . Eczema   . Frequent headaches   . Tuberculosis   . Urticaria     Past Surgical History:  Procedure Laterality Date  . NO PAST SURGERIES     Family History:  Family History  Problem Relation Age of Onset  . Cancer Neg Hx   . Heart disease Neg Hx   . Hypertension Neg Hx   . Hyperlipidemia Neg Hx   . Depression Neg Hx    Family Psychiatric  History: denied Social History:  Social History   Substance and Sexual Activity  Alcohol Use No     Social History  Substance and Sexual Activity  Drug Use No    Social History   Socioeconomic History  . Marital status: Single    Spouse name: Not on file  . Number of children: Not on file  . Years of education: Not on file  . Highest education level: Not on file  Occupational History  . Occupation: STUDENT     Comment: [employer]  Tobacco Use  . Smoking status: Never Smoker  . Smokeless tobacco: Never Used  Substance and Sexual Activity  . Alcohol use: No  . Drug use: No  . Sexual activity: Not  Currently  Other Topics Concern  . Not on file  Social History Narrative  . Not on file   Social Determinants of Health   Financial Resource Strain:   . Difficulty of Paying Living Expenses:   Food Insecurity:   . Worried About Programme researcher, broadcasting/film/video in the Last Year:   . Barista in the Last Year:   Transportation Needs:   . Freight forwarder (Medical):   Marland Kitchen Lack of Transportation (Non-Medical):   Physical Activity:   . Days of Exercise per Week:   . Minutes of Exercise per Session:   Stress:   . Feeling of Stress :   Social Connections:   . Frequency of Communication with Friends and Family:   . Frequency of Social Gatherings with Friends and Family:   . Attends Religious Services:   . Active Member of Clubs or Organizations:   . Attends Banker Meetings:   Marland Kitchen Marital Status:     Has this patient used any form of tobacco in the last 30 days? (Cigarettes, Smokeless Tobacco, Cigars, and/or Pipes) Prescription not provided because: emergency room consultation  Current Medications: Current Facility-Administered Medications  Medication Dose Route Frequency Provider Last Rate Last Admin  . benztropine (COGENTIN) tablet 0.5 mg  0.5 mg Oral BID Patrcia Dolly, FNP   0.5 mg at 03/28/19 8756  . DULoxetine (CYMBALTA) DR capsule 60 mg  60 mg Oral Daily Antonieta Pert, MD   60 mg at 03/28/19 1158  . fluticasone (FLOVENT HFA) 110 MCG/ACT inhaler 1 puff  1 puff Inhalation BID Linwood Dibbles, MD   1 puff at 03/28/19 0921  . haloperidol (HALDOL) tablet 10 mg  10 mg Oral BID Patrcia Dolly, FNP   10 mg at 03/28/19 4332   Or  . haloperidol lactate (HALDOL) injection 10 mg  10 mg Intramuscular BID Patrcia Dolly, FNP      . loratadine (CLARITIN) tablet 10 mg  10 mg Oral Daily Linwood Dibbles, MD   10 mg at 03/28/19 9518  . LORazepam (ATIVAN) tablet 2 mg  2 mg Oral Q6H PRN Patrcia Dolly, FNP   2 mg at 03/28/19 8416   Or  . LORazepam (ATIVAN) injection 2 mg  2 mg Intramuscular Q6H PRN  Patrcia Dolly, FNP   2 mg at 03/27/19 1032  . montelukast (SINGULAIR) tablet 10 mg  10 mg Oral QHS Linwood Dibbles, MD   10 mg at 03/27/19 2113  . QUEtiapine (SEROQUEL) tablet 50 mg  50 mg Oral QHS Antonieta Pert, MD       Current Outpatient Medications  Medication Sig Dispense Refill  . DULoxetine (CYMBALTA) 60 MG capsule Take 2 capsules by mouth once daily (Patient not taking: Reported on 03/26/2019) 180 capsule 0  . fexofenadine (ALLEGRA) 180 MG tablet Take 1 tablet (180 mg total) by mouth daily. (  Patient not taking: Reported on 03/26/2019) 90 tablet 1  . fluticasone (FLOVENT HFA) 110 MCG/ACT inhaler INHALE 2 PUFFS BY MOUTH TWICE DAILY WITH  SPACER (Patient not taking: Reported on 03/26/2019) 36 g 2  . lubiprostone (AMITIZA) 8 MCG capsule Take 1 capsule (8 mcg total) by mouth 2 (two) times daily with a meal. (Patient not taking: Reported on 02/20/2018) 60 capsule 3  . montelukast (SINGULAIR) 10 MG tablet Take 1 tablet (10 mg total) by mouth at bedtime. (Patient not taking: Reported on 03/26/2019) 90 tablet 0  . Triamcinolone Acetonide (TRIAMCINOLONE 0.1 % CREAM : EUCERIN) CREA Apply 1 application topically 2 (two) times daily as needed for rash or itching. (Patient not taking: Reported on 03/26/2019) 500 each 1  . triamcinolone cream (KENALOG) 0.1 % Apply 1 application topically 2 (two) times daily. Up to 14 days at the time. (Patient not taking: Reported on 03/26/2019) 30 g 1   PTA Medications: (Not in a hospital admission)   Musculoskeletal: Strength & Muscle Tone: within normal limits Gait & Station: normal Patient leans: N/A  Psychiatric Specialty Exam: Physical Exam Vitals and nursing note reviewed.     Review of Systems  Blood pressure 128/72, pulse 96, temperature 98.5 F (36.9 C), temperature source Oral, resp. rate 18, SpO2 100 %.There is no height or weight on file to calculate BMI.  General Appearance: Casual  Eye Contact:  Good  Speech:  Normal Rate  Volume:  Normal  Mood:   Anxious  Affect:  Congruent  Thought Process:  Coherent and Descriptions of Associations: Intact  Orientation:  Full (Time, Place, and Person)  Thought Content:  Delusions  Suicidal Thoughts:  No  Homicidal Thoughts:  No  Memory:  Immediate;   Good Recent;   Good Remote;   Good  Judgement:  Intact  Insight:  Fair  Psychomotor Activity:  Normal  Concentration:  Concentration: Good and Attention Span: Good  Recall:  Good  Fund of Knowledge:  Good  Language:  Good  Akathisia:  Negative  Handed:  Right  AIMS (if indicated):     Assets:  Desire for Improvement Resilience  ADL's:  Intact  Cognition:  WNL  Sleep:        Demographic Factors:  Male and Low socioeconomic status  Loss Factors: NA  Historical Factors: Impulsivity  Risk Reduction Factors:   Employed, Living with another person, especially a relative, Positive social support and Positive therapeutic relationship  Continued Clinical Symptoms:  Severe Anxiety and/or Agitation Depression:   Comorbid alcohol abuse/dependence Impulsivity Alcohol/Substance Abuse/Dependencies  Cognitive Features That Contribute To Risk:  None    Suicide Risk:  Minimal: No identifiable suicidal ideation.  Patients presenting with no risk factors but with morbid ruminations; may be classified as minimal risk based on the severity of the depressive symptoms    Plan Of Care/Follow-up recommendations:  Activity:  ad lib Other:  Patient will contact [facility] for follow up appointment in 1-2 weeks after discharge  Disposition: Home Antonieta Pert, MD 03/28/2019, 11:59 AM

## 2019-04-11 ENCOUNTER — Ambulatory Visit (HOSPITAL_COMMUNITY)
Admission: AD | Admit: 2019-04-11 | Discharge: 2019-04-11 | Disposition: A | Discharge status: Home / Self Care | Payer: Self-pay | Attending: Psychiatry | Admitting: Psychiatry

## 2019-04-11 ENCOUNTER — Observation Stay (HOSPITAL_COMMUNITY)
Admission: AD | Admit: 2019-04-11 | Discharge: 2019-04-13 | Disposition: A | Discharge status: Home / Self Care | Payer: Self-pay | Attending: Psychiatry | Admitting: Psychiatry

## 2019-04-11 DIAGNOSIS — F419 Anxiety disorder, unspecified: Secondary | ICD-10-CM | POA: Insufficient documentation

## 2019-04-11 DIAGNOSIS — J45909 Unspecified asthma, uncomplicated: Secondary | ICD-10-CM | POA: Insufficient documentation

## 2019-04-11 DIAGNOSIS — Z20822 Contact with and (suspected) exposure to covid-19: Secondary | ICD-10-CM | POA: Insufficient documentation

## 2019-04-11 DIAGNOSIS — F329 Major depressive disorder, single episode, unspecified: Secondary | ICD-10-CM | POA: Insufficient documentation

## 2019-04-11 DIAGNOSIS — F19959 Other psychoactive substance use, unspecified with psychoactive substance-induced psychotic disorder, unspecified: Principal | ICD-10-CM | POA: Diagnosis present

## 2019-04-11 NOTE — H&P (Signed)
Behavioral Health Medical Screening Exam  Kaymon Qu is an 24 y.o. male who is pleasant but uncooperative with assessment. He engages well and answers questions appropriately. He states he is unclear why they brought him here but wish they would mind their business. He states he is taking his medication and they are mad that he will not tell them where he was at last night. " I know where I was at last night but they do not know where I was at last night. " he was forth coming with information but blunt. He denies suicidal ideations, homicidal ideations, and hallucinations. He does not appear to be responding to internal stimuli.   Total Time spent with patient: 30 minutes  Psychiatric Specialty Exam: Physical Exam  Review of Systems  There were no vitals taken for this visit.There is no height or weight on file to calculate BMI.  General Appearance: Fairly Groomed  Eye Contact:  Good  Speech:  Clear and Coherent and Pressured  Volume:  Normal  Mood:  Euthymic and Irritable  Affect:  Blunt  Thought Process:  Coherent, Linear and Descriptions of Associations: Tangential  Orientation:  Full (Time, Place, and Person)  Thought Content:  Logical and Tangential  Suicidal Thoughts:  No  Homicidal Thoughts:  No  Memory:  Immediate;   Fair Recent;   Fair  Judgement:  Fair  Insight:  Fair  Psychomotor Activity:  Increased  Concentration: Concentration: Fair and Attention Span: Fair  Recall:  [employer] of Knowledge:Fair  Language: Fair  Akathisia:  No  Handed:  Right  AIMS (if indicated):     Assets:  Manufacturing systems engineer Housing Leisure Time Physical Health Social Support Vocational/Educational  Sleep:       Musculoskeletal: Strength & Muscle Tone: within normal limits Gait & Station: normal Patient leans: N/A  There were no vitals taken for this visit.  Recommendations:  Based on my evaluation the patient does not appear to have an emergency medical condition. Will  discharge at this time as patient does not appear to have psychosis, depression, anxiety. He denies suicidal ideation, homicidal ideation, and or hallucinations. He does have his rx that was written by the psychiatrist a few weeks ago. He also did not consent for [place] to talk to his family. Writer went and spoke to his roommates outside and advised that we could not force him to come into the hospital. Writer advised they initiate an IVC if they are concerned about his behaviors and wellbeing. They agreed and verbalized understanding. Writer answered all questions and concerns with the four of them individually outside at the picnic table where they were waiting. They were also given the address to the magistrate, and reported patient was just released from jail for trespassing 3 days ago.   Maryagnes Amos, FNP 04/11/2019, 6:30 PM

## 2019-04-11 NOTE — BH Assessment (Signed)
Assessment Note  Jeffrey Hall is a single 24 y.o. male who presents voluntarily to [place] for a walk-in assessment. Pt was accompanied by a friend who goes by pt's cousin but is not a blood relative per pt. Pt denies symptoms of depression. He denies suicidal ideation. Pt has a history of recent ED assessment and says his "cousin" and the others wanted pt to come for assessment. Initially pt was agreeable to this writer speaking with the cousin, but then stated no, he only wanted this Probation officer to hear his truth, not theirs. Pt made multiple requests for copy of this writer's notes about him. Pt denies current suicidal ideation. He denies past suicide attempts. Pt denies homicidal ideation/ history of violence. Pt denies auditory & visual hallucinations & other symptoms of psychosis. Pt denies current stressors. 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  Pt lives with non-relatives/people who also emigrated from [place], [place]. Supports include God. Pt denies hx of abuse and trauma. Pt has partial insight and judgment. Pt's memory is intact. Pt denies hx of legal problems.  Protective factors against suicide include no current suicidal ideation, no access to firearms, and no prior attempts.?  Pt's OP history includes Donnal Moat, [place]. Pt denies alcohol/ substance abuse. He stated he didn't know what marijuana is- but recent tox screen positive for THC.  ?  MSE: Pt is casually dressed, alert, oriented x4 with pressured speech. He is argumentative & challenging at times. Motor behavior is normal. Eye contact is good. Pt's mood is suspicious and irritable and affect is apprehensive and constricted. Affect is congruent with mood. Thought process is coherent and relevant.  Disposition: Priscille Loveless, NP recommends discharge from ED. Cousin advised to present to magistrate their  concerns for safety.  Diagnosis: Major Depressive Disorder, recurrent, severe with sx of psychosis  Past Medical History:      Past Medical History:  Diagnosis Date  . Asthma   . Eczema   . Frequent headaches   . Tuberculosis   . Urticaria         Past Surgical History:  Procedure Laterality Date  . NO PAST SURGERIES     Family History:       Family History  Problem Relation Age of Onset  . Cancer Neg Hx   . Heart disease Neg Hx   . Hypertension Neg Hx   . Hyperlipidemia Neg Hx   . Depression Neg Hx    Social History: reports that he has never smoked. He has never used smokeless tobacco. He reports that he does not drink alcohol or use drugs.  Additional Social History: Alcohol / Drug Use  Pain Medications: denies  Prescriptions: denies  Over the Counter: denies  History of alcohol / drug use?: No history of alcohol / drug abuse(Pt denies)  CIWA: CIWA-Ar  BP: 128/72  Pulse Rate: 96  COWS:  Allergies: No Known Allergies  Home Medications: (Not in a hospital admission)   OB/GYN Status: No LMP for male patient.  General Assessment Data  Assessment unable to be completed: Yes  Reason for not completing assessment: Pt is still asleep at this time and is unable to be aroused  Location of Assessment: [facility]  TTS Assessment: In system  Is this a Tele or Face-to-Face Assessment?: Face-to-Face  Is this an Initial Assessment or a  Re-assessment for this encounter?: Initial Assessment  Patient Accompanied by:: N/A("cousin" in lobby)  Language Other than English: Yes  Living Arrangements: Other (Comment)  What gender do you identify as?: Male  Marital status: Single  Living Arrangements: Non-relatives/Friends(others who immigrated to [place] with him)  Can pt return to current living arrangement?: Yes  Admission Status: Voluntary  Is patient capable of signing voluntary admission?: Yes  Referral Source: Self/Family/Friend  Insurance type: None  Medical  Screening Exam ([place])  Medical Exam completed: Yes  Crisis Care Plan  Living Arrangements: Non-relatives/Friends(others who immigrated to [place] with him)  Legal Guardian: (self)  Name of Psychiatrist: Donnal Moat, [place]  Name of Therapist: none  Education Status  Is patient currently in school?: No(pt taking a break this semester from [employer] A/C school)  Is the patient employed, unemployed or receiving disability?: 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 : No  Suicidal Intent: No  Has patient had any suicidal intent within the past 6 months prior to admission? : No  Is patient at risk for suicide?: Yes  Suicidal Plan?: No  Has patient had any suicidal plan within the past 6 months prior to admission? : No  What has been your use of drugs/alcohol within the last 12 months?: denies all drug use  Previous Attempts/Gestures: No  How many times?: 0  Other Self Harm Risks: paranoia, recent MH tx, lack of social support  Intentional Self Injurious Behavior: None  Family Suicide History: Unknown  Recent stressful life event(s): (denies)  Persecutory voices/beliefs?: No(pt denies)  Depression: No(pt denies)  Substance abuse history and/or treatment for substance abuse?: No  Suicide prevention information given to non-admitted patients: Not applicable  Risk to Others within the past 6 months  Homicidal Ideation: No  Does patient have any lifetime risk of violence toward others beyond the six months prior to admission? : Yes (comment)(pt denies; ED record reflects aggression toward others)  Thoughts of Harm to Others: No-Not Currently Present/Within Last 6 Months  Current Homicidal Intent: No  Current Homicidal Plan: No  Access to Homicidal Means: No  History of harm to others?: Yes  Assessment of Violence: In past 6-12 months  Violent  Behavior Description: threatening and combative to police past ED admission  Does patient have access to weapons?: No  Criminal Charges Pending?: No  Does patient have a court date: No  Is patient on probation?: No  Psychosis  Hallucinations: None noted  Delusions: None noted(pt denies)  Mental Status Report  Appearance/Hygiene: Unremarkable  Eye Contact: Good  Motor Activity: Freedom of movement  Speech: Logical/coherent, Argumentative, Pressured  Level of Consciousness: Irritable  Mood: Suspicious, Irritable  Affect: Apprehensive, Constricted  Anxiety Level: Minimal  Thought Processes: Relevant, Coherent  Judgement: Partial  Orientation: Appropriate for developmental age  Obsessive Compulsive Thoughts/Behaviors: None  Cognitive Functioning  Concentration: Normal  Memory: Recent Intact, Remote Intact  Is patient IDD: No  Insight: Poor  Impulse Control: Fair  Appetite: Fair  Have you had any weight changes? : No Change  Sleep: No Change  Total Hours of Sleep: 6  Vegetative Symptoms: None  ADLScreening [facility] Assessment Services)  Patient's cognitive ability adequate to safely complete daily activities?: Yes  Patient able to express need for assistance with ADLs?: Yes  Independently performs ADLs?: Yes (appropriate for developmental age)  Prior Inpatient Therapy  Prior Inpatient Therapy: No  Prior Outpatient Therapy  Prior Outpatient Therapy: No  Does patient have an ACCT team?: No  Does patient have Intensive In-House Services? : No  Does patient have [facility] services? : No  Does patient have P4CC services?: No  ADL Screening (condition at time of admission)  Patient's cognitive ability adequate to safely complete daily activities?: Yes  Is the patient deaf or have difficulty hearing?: No  Does the patient have difficulty seeing, even when wearing glasses/contacts?: No  Does the patient have difficulty concentrating, remembering, or making decisions?: No  Patient able  to express need for assistance with ADLs?: Yes  Does the patient have difficulty dressing or bathing?: No  Independently performs ADLs?: Yes (appropriate for developmental age)  Does the patient have difficulty walking or climbing stairs?: No  Weakness of Legs: None  Weakness of Arms/Hands: None  Home Assistive Devices/Equipment  Home Assistive Devices/Equipment: None  Therapy Consults (therapy consults require a physician order)  PT Evaluation Needed: No  OT Evalulation Needed: No  SLP Evaluation Needed: No  Abuse/Neglect Assessment (Assessment to be complete while patient is alone)  Abuse/Neglect Assessment Can Be Completed: Yes  Physical Abuse: Denies  Verbal Abuse: Denies  Sexual Abuse: Denies  Exploitation of patient/patient's resources: Denies  Self-Neglect: Denies  Values / Beliefs  Cultural Requests During Hospitalization: None  Spiritual Requests During Hospitalization: None  Consults  Spiritual Care Consult Needed: No  Transition of Care Team Consult Needed: No  Advance Directives (For Healthcare)  Does Patient Have a Medical Advance Directive?: No  Would patient like information on creating a medical advance directive?: No - Patient declined     Disposition: Priscille Loveless, NP recommends discharge from ED. Cousin advised to present to magistrate their concerns for safety.  Disposition  Initial Assessment Completed for this Encounter: Yes  Disposition of Patient: Discharge  On Site Evaluation by:  Reviewed with Physician:  Richardean Chimera  04/11/2019 6:53 P   Past Medical History:  Past Medical History:  Diagnosis Date  . Asthma   . Eczema   . Frequent headaches   . Tuberculosis   . Urticaria     Past Surgical History:  Procedure Laterality Date  . NO PAST SURGERIES      Family History:  Family History  Problem Relation Age of Onset  . Cancer Neg Hx   . Heart disease Neg Hx   . Hypertension Neg Hx   . Hyperlipidemia Neg Hx   . Depression Neg  Hx     Social History:  reports that he has never smoked. He has never used smokeless tobacco. He reports that he does not drink alcohol or use drugs.  Additional Social History:     CIWA:   COWS:    Allergies: No Known Allergies  Home Medications: (Not in a hospital admission)   OB/GYN Status:  No LMP for male patient.                                                               Disposition:     On Site Evaluation by:   Reviewed with Physician:    Richardean Chimera 04/11/2019 7:26 PM

## 2019-04-11 NOTE — BH Assessment (Signed)
Assessment Note  Jeffrey Hall is a single 24 y.o. male who presents voluntarily to [place] for a walk-in assessment. Pt was accompanied by a friend who goes by pt's cousin but is not a blood relative per pt. Pt denies symptoms of depression. He denies suicidal ideation. Pt has a history of recent ED assessment and says his "cousin" and the others wanted pt to come for assessment. Initially pt was agreeable to this writer speaking with the cousin, but then stated no, he only wanted this Probation officer to hear his truth, not theirs. Pt made multiple requests for copy of this writer's notes about him.  Pt denies current suicidal ideation. He denies past suicide attempts. Pt denies homicidal ideation/ history of violence. Pt denies auditory & visual hallucinations & other symptoms of psychosis. Pt denies current stressors. 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   Pt lives with non-relatives/people who also emigrated from [place], [place]. Supports include God. Pt denies hx of abuse and trauma. Pt has partial insight and judgment. Pt's memory is intact. Pt denies hx of legal problems.  Protective factors against suicide include no current suicidal ideation, no access to firearms, and no prior attempts.?  Pt's OP history includes Jeffrey Hall, [place].  Pt denies alcohol/ substance abuse. He stated he didn't know what marijuana is- but recent tox screen positive for THC. ? MSE: Pt is casually dressed, alert, oriented x4 with pressured speech. He is argumentative & challenging at times. Motor behavior is normal. Eye contact is good. Pt's mood is suspicious and irritable and affect is apprehensive and constricted. Affect is congruent with mood. Thought process is coherent and relevant.   Disposition:  Jeffrey Loveless, NP recommends discharge from ED. Cousin advised to present to  magistrate their concerns for safety.  Diagnosis: Major Depressive Disorder, recurrent, severe with sx of psychosis  Past Medical History:  Past Medical History:  Diagnosis Date  . Asthma   . Eczema   . Frequent headaches   . Tuberculosis   . Urticaria     Past Surgical History:  Procedure Laterality Date  . NO PAST SURGERIES      Family History:  Family History  Problem Relation Age of Onset  . Cancer Neg Hx   . Heart disease Neg Hx   . Hypertension Neg Hx   . Hyperlipidemia Neg Hx   . Depression Neg Hx     Social History:  reports that he has never smoked. He has never used smokeless tobacco. He reports that he does not drink alcohol or use drugs.  Additional Social History:  Alcohol / Drug Use Pain Medications: denies Prescriptions: denies Over the Counter: denies History of alcohol / drug use?: No history of alcohol / drug abuse(Pt denies)  CIWA: CIWA-Ar BP: 128/72 Pulse Rate: 96 COWS:    Allergies: No Known Allergies  Home Medications: (Not in a hospital admission)   OB/GYN Status:  No LMP for male patient.  General Assessment Data Assessment unable to be completed: Yes Reason for not completing assessment: Pt is still asleep at this time and is unable to be aroused Location of Assessment: [facility] Assessment [facility] TTS Assessment: In system Is this a Tele or Face-to-Face Assessment?: Face-to-Face Is this an Initial Assessment or a Re-assessment for this encounter?: Initial Assessment Patient Accompanied by:: N/A("cousin" in lobby) Language Other than English:  Yes Living Arrangements: Other (Comment) What gender do you identify as?: Male Marital status: Single Living Arrangements: Non-relatives/Friends(others who immigrated to [place] with him) Can pt return to current living arrangement?: Yes Admission Status: Voluntary Is patient capable of signing voluntary admission?: Yes Referral Source: Self/Family/Friend Insurance type: None  Medical Screening  Exam ([place]) Medical Exam completed: Yes  Crisis Care Plan Living Arrangements: Non-relatives/Friends(others who immigrated to [place] with him) Legal Guardian: (self) Name of Psychiatrist: Donnal Moat, [place] Name of Therapist: none  Education Status Is patient currently in school?: No(pt taking a break this semester from [employer] A/[employer] school) Is the patient employed, unemployed or receiving disability?: 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 : No Suicidal Intent: No Has patient had any suicidal intent within the past 6 months prior to admission? : No Is patient at risk for suicide?: Yes Suicidal Plan?: No Has patient had any suicidal plan within the past 6 months prior to admission? : No What has been your use of drugs/alcohol within the last 12 months?: denies all drug use Previous Attempts/Gestures: No How many times?: 0 Other Self Harm Risks: paranoia, recent MH tx, lack of social support Intentional Self Injurious Behavior: None Family Suicide History: Unknown Recent stressful life event(s): (denies) Persecutory voices/beliefs?: No(pt denies) Depression: No(pt denies) Substance abuse history and/or treatment for substance abuse?: No Suicide prevention information given to non-admitted patients: Not applicable  Risk to Others within the past 6 months Homicidal Ideation: No Does patient have any lifetime risk of violence toward others beyond the six months prior to admission? : Yes (comment)(pt denies; ED record reflects aggression toward others) Thoughts of Harm to Others: No-Not Currently Present/Within Last 6 Months Current Homicidal Intent: No Current Homicidal Plan: No Access to Homicidal Means: No History of harm to others?: Yes Assessment of Violence: In past 6-12 months Violent Behavior Description: threatening and  combative to police past ED admission Does patient have access to weapons?: No Criminal Charges Pending?: No Does patient have a court date: No Is patient on probation?: No  Psychosis Hallucinations: None noted Delusions: None noted(pt denies)  Mental Status Report Appearance/Hygiene: Unremarkable Eye Contact: Good Motor Activity: Freedom of movement Speech: Logical/coherent, Argumentative, Pressured Level of Consciousness: Irritable Mood: Suspicious, Irritable Affect: Apprehensive, Constricted Anxiety Level: Minimal Thought Processes: Relevant, Coherent Judgement: Partial Orientation: Appropriate for developmental age Obsessive Compulsive Thoughts/Behaviors: None  Cognitive Functioning Concentration: Normal Memory: Recent Intact, Remote Intact Is patient IDD: No Insight: Poor Impulse Control: Fair Appetite: Fair Have you had any weight changes? : No Change Sleep: No Change Total Hours of Sleep: 6 Vegetative Symptoms: None  ADLScreening [facility] Assessment Services) Patient's cognitive ability adequate to safely complete daily activities?: Yes Patient able to express need for assistance with ADLs?: Yes Independently performs ADLs?: Yes (appropriate for developmental age)  Prior Inpatient Therapy Prior Inpatient Therapy: No  Prior Outpatient Therapy Prior Outpatient Therapy: No Does patient have an ACCT team?: No Does patient have Intensive In-House Services?  : No Does patient have [facility] services? : No Does patient have P4CC services?: No  ADL Screening (condition at time of admission) Patient's cognitive ability adequate to safely complete daily activities?: Yes Is the patient deaf or have difficulty hearing?: No Does the patient have difficulty seeing, even when wearing glasses/contacts?: No Does the patient have difficulty concentrating, remembering, or making decisions?:  No Patient able to express need for assistance with ADLs?: Yes Does the patient have  difficulty dressing or bathing?: No Independently performs ADLs?: Yes (appropriate for developmental age) Does the patient have difficulty walking or climbing stairs?: No Weakness of Legs: None Weakness of Arms/Hands: None  Home Assistive Devices/Equipment Home Assistive Devices/Equipment: None  Therapy Consults (therapy consults require a physician order) PT Evaluation Needed: No OT Evalulation Needed: No SLP Evaluation Needed: No Abuse/Neglect Assessment (Assessment to be complete while patient is alone) Abuse/Neglect Assessment Can Be Completed: Yes Physical Abuse: Denies Verbal Abuse: Denies Sexual Abuse: Denies Exploitation of patient/patient's resources: Denies Self-Neglect: Denies Values / Beliefs Cultural Requests During Hospitalization: None Spiritual Requests During Hospitalization: None Consults Spiritual Care Consult Needed: No Transition of Care Team Consult Needed: No Advance Directives (For Healthcare) Does Patient Have a Medical Advance Directive?: No Would patient like information on creating a medical advance directive?: No - Patient declined          Disposition: Jeffrey Loveless, NP recommends discharge from ED. Cousin advised to present to magistrate their concerns for safety. Disposition Initial Assessment Completed for this Encounter: Yes Disposition of Patient: Discharge  On Site Evaluation by:   Reviewed with Physician:    Richardean Chimera 04/11/2019 6:53 PM

## 2019-04-12 ENCOUNTER — Encounter (HOSPITAL_COMMUNITY): Payer: Self-pay | Admitting: Nurse Practitioner

## 2019-04-12 ENCOUNTER — Other Ambulatory Visit: Payer: Self-pay

## 2019-04-12 DIAGNOSIS — F19959 Other psychoactive substance use, unspecified with psychoactive substance-induced psychotic disorder, unspecified: Secondary | ICD-10-CM | POA: Diagnosis present

## 2019-04-12 LAB — COMPREHENSIVE METABOLIC PANEL
ALT: 37 U/L (ref 0–44)
AST: 35 U/L (ref 15–41)
Albumin: 4.5 g/dL (ref 3.5–5.0)
Alkaline Phosphatase: 79 U/L (ref 38–126)
Anion gap: 10 (ref 5–15)
BUN: 8 mg/dL (ref 6–20)
CO2: 25 mmol/L (ref 22–32)
Calcium: 9.3 mg/dL (ref 8.9–10.3)
Chloride: 105 mmol/L (ref 98–111)
Creatinine, Ser: 0.98 mg/dL (ref 0.61–1.24)
GFR calc Af Amer: >60 mL/min (ref >60)
GFR calc non Af Amer: >60 mL/min (ref >60)
Glucose, Bld: 105 mg/dL — ABNORMAL HIGH (ref 70–99)
Potassium: 3.3 mmol/L — ABNORMAL LOW (ref 3.5–5.1)
Sodium: 140 mmol/L (ref 135–145)
Total Bilirubin: 0.8 mg/dL (ref 0.3–1.2)
Total Protein: 8 g/dL (ref 6.5–8.1)

## 2019-04-12 LAB — CBC
HCT: 47.3 % (ref 39.0–52.0)
Hemoglobin: 15.7 g/dL (ref 13.0–17.0)
MCH: 29.2 pg (ref 26.0–34.0)
MCHC: 33.2 g/dL (ref 30.0–36.0)
MCV: 88.1 fL (ref 80.0–100.0)
Platelets: 319 10*3/uL (ref 150–400)
RBC: 5.37 MIL/uL (ref 4.22–5.81)
RDW: 12.1 % (ref 11.5–15.5)
WBC: 4.9 10*3/uL (ref 4.0–10.5)
nRBC: 0 % (ref 0.0–0.2)

## 2019-04-12 LAB — RESPIRATORY PANEL BY RT PCR (FLU A&B, COVID)
Influenza A by PCR: NEGATIVE
Influenza B by PCR: NEGATIVE
SARS Coronavirus 2 by RT PCR: NEGATIVE

## 2019-04-12 MED ORDER — ALUM & MAG HYDROXIDE-SIMETH 200-200-20 MG/5ML PO SUSP: 30.0000 mL | ORAL | Status: DC | PRN

## 2019-04-12 MED ORDER — FLUTICASONE PROPIONATE HFA 110 MCG/ACT IN AERO
2.0000 | INHALATION_SPRAY | Freq: Two times a day (BID) | RESPIRATORY_TRACT | Status: DC
  Administered 2019-04-12 – 2019-04-13 (×2): 2 via RESPIRATORY_TRACT
  Filled 2019-04-12 (×2): qty 12

## 2019-04-12 MED ORDER — HYDROXYZINE HCL 25 MG PO TABS
25.0000 mg | ORAL_TABLET | Freq: Three times a day (TID) | ORAL | Status: DC | PRN
  Administered 2019-04-12: 25 mg via ORAL
  Filled 2019-04-12: qty 1

## 2019-04-12 MED ORDER — TRAZODONE HCL 50 MG PO TABS: 50.0000 mg | ORAL_TABLET | Freq: Every evening | ORAL | Status: DC | PRN

## 2019-04-12 MED ORDER — HYDROXYZINE HCL 25 MG PO TABS
25.0000 mg | ORAL_TABLET | Freq: Two times a day (BID) | ORAL | Status: DC
  Administered 2019-04-12 – 2019-04-13 (×2): 25 mg via ORAL
  Filled 2019-04-12 (×2): qty 1

## 2019-04-12 MED ORDER — ZIPRASIDONE MESYLATE 20 MG IM SOLR
INTRAMUSCULAR | Status: AC
  Filled 2019-04-12: qty 20

## 2019-04-12 MED ORDER — OLANZAPINE 10 MG IM SOLR
10.0000 mg | Freq: Once | INTRAMUSCULAR | Status: DC | PRN
  Filled 2019-04-12: qty 10

## 2019-04-12 MED ORDER — LORAZEPAM 2 MG/ML IJ SOLN
2.0000 mg | Freq: Once | INTRAMUSCULAR | Status: AC
  Administered 2019-04-12: 2 mg via INTRAMUSCULAR

## 2019-04-12 MED ORDER — LORAZEPAM 2 MG/ML IJ SOLN
INTRAMUSCULAR | Status: AC
  Filled 2019-04-12: qty 1

## 2019-04-12 MED ORDER — DIPHENHYDRAMINE HCL 50 MG/ML IJ SOLN: 50.0000 mg | Freq: Once | INTRAMUSCULAR | Status: AC

## 2019-04-12 MED ORDER — RISPERIDONE 1 MG PO TABS
1.0000 mg | ORAL_TABLET | Freq: Two times a day (BID) | ORAL | Status: DC
  Administered 2019-04-12 – 2019-04-13 (×2): 1 mg via ORAL
  Filled 2019-04-12 (×2): qty 1

## 2019-04-12 MED ORDER — DIPHENHYDRAMINE HCL 50 MG/ML IJ SOLN
INTRAMUSCULAR | Status: AC
  Administered 2019-04-12: 50 mg via INTRAMUSCULAR
  Filled 2019-04-12: qty 1

## 2019-04-12 MED ORDER — HYDROXYZINE HCL 10 MG PO TABS: 10.0000 mg | ORAL_TABLET | Freq: Two times a day (BID) | ORAL | Status: DC

## 2019-04-12 MED ORDER — ACETAMINOPHEN 325 MG PO TABS: 650.0000 mg | ORAL_TABLET | Freq: Four times a day (QID) | ORAL | Status: DC | PRN

## 2019-04-12 MED ORDER — HYDROXYZINE HCL 25 MG PO TABS: 25.0000 mg | ORAL_TABLET | Freq: Two times a day (BID) | ORAL | Status: DC

## 2019-04-12 MED ORDER — DULOXETINE HCL 60 MG PO CPEP
60.0000 mg | ORAL_CAPSULE | Freq: Every day | ORAL | Status: DC
  Administered 2019-04-12: 60 mg via ORAL
  Filled 2019-04-12: qty 1

## 2019-04-12 MED ORDER — MAGNESIUM HYDROXIDE 400 MG/5ML PO SUSP: 30.0000 mL | Freq: Every day | ORAL | Status: DC | PRN

## 2019-04-12 MED ORDER — OLANZAPINE 10 MG PO TBDP: 10.0000 mg | ORAL_TABLET | Freq: Once | ORAL | Status: DC | PRN

## 2019-04-12 MED ORDER — ZIPRASIDONE MESYLATE 20 MG IM SOLR
20.0000 mg | Freq: Once | INTRAMUSCULAR | Status: AC
  Administered 2019-04-12: 20 mg via INTRAMUSCULAR

## 2019-04-12 MED ORDER — QUETIAPINE FUMARATE 50 MG PO TABS: 50.0000 mg | ORAL_TABLET | Freq: Every day | ORAL | Status: DC

## 2019-04-12 MED ORDER — MONTELUKAST SODIUM 10 MG PO TABS: 10.0000 mg | ORAL_TABLET | Freq: Every day | ORAL | Status: DC

## 2019-04-12 NOTE — Progress Notes (Addendum)
Pt very upset and agitated, demanding to speak with previous nurse Ethelene Browns.  Pt demanding to leave facility.  Pt speaking very obscene language to male staff members.  Pt repeatedly stated, I am going to fuck the male staff members, spread your legs now. As witnessed by all other staff members. NP Nira Conn and [facility] Binnie Rail in hallway to speak with pt to no avail.

## 2019-04-12 NOTE — Progress Notes (Signed)
Pt sedated, sleeping, sonorous respirations, skin color good.  NPJason Hall at bedside to re-eval pt.  Notified AC JoAnn, pending placement in room 206 to rest, to be taken out of restraint chair.

## 2019-04-12 NOTE — Progress Notes (Addendum)
Patient ID: Jeffrey Hall, male   DOB: [birth year], 24 y.o.   MRN: [id number]   Pt alert and oriented on the unit. Pt in his room and frequently flushing the toilet in the bathroom and running the water. Pt is also heard giggling and frequently standing at his room door. Support and encouragement provided, q15 minute safety checks remain in effect. Medications administered per MD orders. No reactions/side effects to medicine noted. Pt remains safe on the unit.

## 2019-04-12 NOTE — B[facility] Counselor (Addendum)
Pt was seen earlier by TTS counselor and NP Malachy Chamber on 04/11/19 around 1715 and DC with resources. Pt has returned to [facility] under IVC on 04/11/19 at 2055. TTS completed reassessment for safety. Pt states he was in jail for a few days, says he feels he is here due to his family being concerned about him, but was very hesitant on providing further information and details. Pt was forthcoming about any additional information. Pt denied SI, HI, AVH and self injurious behaviors as well as drug use. Pt presented a bit confused and disorganized.He does not appear to be responding to internal stimuli or delusional content.  Collateral: TTS contacted Mearl Latin, pts cousin who filed IVC at [phone]. He states that pt was released from jail [place] trespassing at mall [place] 3 days. He states upon release pt mental health, aggressiveness, depression worsened. He states that pt engages in abusing cocaine but not sure how much. He states that is preoccupied with God talking to him and predicting the futute. He states that pt currently has no provider and is not taking any medications. He states that pt also has never threatened SI or expressed SI thoughts or has any SI attempt to his knowledge.    Disposition: Nira Conn, FNP has recommend pt be kept for overnight observation, reassess in the morning.

## 2019-04-12 NOTE — H&P (Signed)
BH Observation Unit Provider Admission PAA/H&P  Patient Identification: Jeffrey Hall MRN:  [id number] Date of Evaluation:  04/12/2019 Chief Complaint:  Substance-induced psychotic disorder [facility](HCC) [F19.959] Principal Diagnosis: Substance-induced psychotic disorder (HCC) Diagnosis:  Principal Problem:   Substance-induced psychotic disorder (HCC)  History of Present Illness:  TTS Assessment:  Pt was seen earlier by TTS counselor and NP Malachy Chamberakia, [place] on 04/11/19 around 1715 and DC with resources. [facility] on 04/11/19 at 2055. TTS completed reassessment for safety. Pt states he was in jail for a few days, says he feels he is here due to his family being concerned about him, but was very hesitant on providing further information and details. Pt was forthcoming about any additional information. Pt denied SI, HI, AVH and self injurious behaviors as well as drug use. Pt presented a bit confused and disorganized.He does not appear to be responding to internal stimuli or delusional content.  Collateral: TTS contacted Mearl LatinAnassou Banna, pts cousin who filed IVC at [phone]. He states that pt was released from jail for trespassing at mall for 3 days. He states upon release pt mental health, aggressiveness, depression worsened. He states that pt engages in abusing cocaine but not sure how much. He states that is preoccupied with God talking to him and predicting the futute. He states that pt currently has no provider and is not taking any medications. He states that pt also has never threatened SI or expressed SI thoughts or has any SI attempt to his knowledge.   Evaluation on Unit: Reviewed TTS assessment and validated with patient. On evaluation patient is alert and oriented to place. He appears confused to [facility] and expresses surprise when he realizes it is 11 pm and not 11 am. Speech is clear and coherent. Mood is depressed and affect is congruent with mood. Patient does  not share much information. Denies suicidal ideations. Denies homicidal ideations. Denies substance abuse. UDS was positive for marijuana and benzodiazepines on 3/10/202.  Denies audiovisual hallucinations. No indication that patient is responding to internal stimuli. When asked about medications, he states that he has medications. But would not respond when asked if he was taking medications.  [facility] and [facility]. After discharge, he was petitioned for IVC by his cousin.  [facility] for depression and aggressive behaviors. Patient was monitored for two days and discharged on 03/28/2019. At discharge, he was prescribed Cymbalta at 60 mg p.o. daily and Seroquel 50 mg p.o. nightly.     Associated Signs/Symptoms: Depression Symptoms:  depressed mood, anxiety, (Hypo) Manic Symptoms:  Distractibility, Impulsivity, Irritable Mood, Anxiety Symptoms:  Excessive Worry, Psychotic Symptoms:  Denies PTSD Symptoms: Negative Total Time spent with patient: 30 minutes  Past Psychiatric History: MDD with psychotic features. [facility] for depression and aggressive behaviors. Patient was monitored for two days and discharged on 03/28/2019. At discharge, he was prescribed Cymbalta at 60 mg p.o. daily and Seroquel 50 mg p.o. nightly. [facility]    Is the patient at risk to self? No.  Has the patient been a risk to self in the past 6 months? No.  Has the patient been a risk to self within the distant past? No.  Is the patient a risk to others? No.  Has the patient been a risk to others in the past 6 months? Yes.  Has the patient been a risk to others within the distant past? No.   Prior Inpatient Therapy:   Prior Outpatient Therapy:    Alcohol Screening: 1. How often do you have a drink containing alcohol?: Never 2. How many drinks containing  alcohol do you have on a typical day when you are drinking?: 1 or 2 3. How often do you have six or more drinks on one occasion?: Never AUDIT-C Score: 0 4. How often during the last year have you found that you were not able to stop drinking once you had started?: Never 5. How often during the last year have you failed to do what was normally expected from you becasue of drinking?: Never 6. How often during the last year have you needed a first drink in the morning to get yourself going after a heavy drinking session?: Never 7. How often during the last year have you had a feeling of guilt of remorse after drinking?: Never 8. How often during the last year have you been unable to remember what happened the night before because you had been drinking?: Never 9. Have you or someone else been injured as a result of your drinking?: No 10. Has a relative or friend or a doctor or another health worker been concerned about your drinking or suggested you cut down?: No Alcohol Use Disorder Identification Test Final Score (AUDIT): 0 Alcohol Brief Interventions/Follow-up: AUDIT Score <7 follow-up not indicated Substance Abuse History in the last 12 months:  Yes.   Consequences of Substance Abuse: Medical Consequences:  Psychotic symptoms Previous Psychotropic Medications: Yes  Psychological Evaluations: Yes  Past Medical History:  Past Medical History:  Diagnosis Date  . Asthma   . Eczema   . Frequent headaches   . Tuberculosis   . Urticaria     Past Surgical History:  Procedure Laterality Date  . NO PAST SURGERIES     Family History:  Family History  Problem Relation Age of Onset  . Cancer Neg Hx   . Heart disease Neg Hx   . Hypertension Neg Hx   . Hyperlipidemia Neg Hx   . Depression Neg Hx    Family Psychiatric History: Unknown Tobacco Screening:   Social History:  Social History   Substance and Sexual Activity  Alcohol Use No     Social History   Substance and Sexual Activity   Drug Use No    Additional Social History:      Allergies:  No Known Allergies Lab Results:  Results for orders placed or performed during the hospital encounter of 04/11/19 (from the past 48 hour(s))  Respiratory Panel by RT PCR (Flu A&B, Covid) - Nasopharyngeal Swab     Status: None   Collection Time: 04/12/19  1:32 AM   Specimen: Nasopharyngeal Swab  Result Value Ref Range   SARS Coronavirus 2 by RT PCR NEGATIVE NEGATIVE    Comment: (NOTE) SARS-CoV-2 target nucleic acids are NOT DETECTED. The SARS-CoV-2 RNA is generally detectable in upper respiratoy specimens during the acute phase of infection. The lowest concentration of SARS-CoV-2 viral copies this assay can detect is 131 copies/mL. A negative result does not preclude SARS-Cov-2 infection and should not be used as the sole basis for treatment or other patient management decisions. A negative result may occur with  improper specimen collection/handling, submission of specimen other than nasopharyngeal swab, presence of viral mutation(s) within the areas targeted by this assay, and inadequate number of viral copies (<131 copies/mL). A negative result must  be combined with clinical observations, patient history, and epidemiological information. The expected result is Negative. Fact Sheet for Patients:  https://www.moore.com/ Fact Sheet for Healthcare Providers:  https://www.young.biz/ This test is not yet ap proved or cleared by the [place] FDA and  has been authorized for detection and/or diagnosis of SARS-CoV-2 by FDA under an Emergency Use Authorization (EUA). This EUA will remain  in effect (meaning this test can be used) for the duration of the COVID-19 declaration under Section 564(b)(1) of the Act, 21 U.S.C. section 360bbb-3(b)(1), unless the authorization is terminated or revoked sooner.    Influenza A by PCR NEGATIVE NEGATIVE   Influenza B by PCR NEGATIVE NEGATIVE     Comment: (NOTE) The Xpert Xpress SARS-CoV-2/FLU/RSV assay is intended as an aid in  the diagnosis of influenza from Nasopharyngeal swab specimens and  should not be used as a sole basis for treatment. Nasal washings and  aspirates are unacceptable for Xpert Xpress SARS-CoV-2/FLU/RSV  testing. Fact Sheet for Patients: https://www.moore.com/ Fact Sheet for Healthcare Providers: https://www.young.biz/ This test is not yet approved or cleared by the [place] FDA and  has been authorized for detection and/or diagnosis of SARS-CoV-2 by  FDA under an Emergency Use Authorization (EUA). This EUA will remain  in effect (meaning this test can be used) for the duration of the  Covid-19 declaration under Section 564(b)(1) of the Act, 21  U.S.C. section 360bbb-3(b)(1), unless the authorization is  terminated or revoked. Performed at [facility], [address], [place], [place] [address]     Blood Alcohol level:  Lab Results  Component Value Date   ETH <10 03/25/2019    Metabolic Disorder Labs:  No results found for: HGBA1C, MPG No results found for: PROLACTIN No results found for: CHOL, TRIG, HDL, CHOLHDL, VLDL, LDLCALC  Current Medications: Current Facility-Administered Medications  Medication Dose Route Frequency Provider Last Rate Last Admin  . acetaminophen (TYLENOL) tablet 650 mg  650 mg Oral Q6H PRN Nira Conn A, NP      . alum & mag hydroxide-simeth (MAALOX/MYLANTA) 200-200-20 MG/5ML suspension 30 mL  30 mL Oral Q4H PRN Jackelyn Poling, NP      . DULoxetine (CYMBALTA) DR capsule 60 mg  60 mg Oral Daily Nira Conn A, NP      . fluticasone (FLOVENT HFA) 110 MCG/ACT inhaler 2 puff  2 puff Inhalation BID Nira Conn A, NP      . hydrOXYzine (ATARAX/VISTARIL) tablet 25 mg  25 mg Oral TID PRN Nira Conn A, NP      . magnesium hydroxide (MILK OF MAGNESIA) suspension 30 mL  30 mL Oral Daily PRN Nira Conn A, NP      .  montelukast (SINGULAIR) tablet 10 mg  10 mg Oral QHS Nira Conn A, NP      . OLANZapine (ZYPREXA) injection 10 mg  10 mg Intramuscular Once PRN Jackelyn Poling, NP       Or  . OLANZapine zydis (ZYPREXA) disintegrating tablet 10 mg  10 mg Oral Once PRN Nira Conn A, NP      . QUEtiapine (SEROQUEL) tablet 50 mg  50 mg Oral QHS Nira Conn A, NP      . traZODone (DESYREL) tablet 50 mg  50 mg Oral QHS PRN Jackelyn Poling, NP       PTA Medications: Medications Prior to Admission  Medication Sig Dispense Refill Last Dose  . DULoxetine (CYMBALTA) 60 MG capsule Take 2 capsules by mouth once  daily (Patient not taking: Reported on 03/26/2019) 180 capsule 0 Unknown at Unknown time  . fexofenadine (ALLEGRA) 180 MG tablet Take 1 tablet (180 mg total) by mouth daily. (Patient not taking: Reported on 03/26/2019) 90 tablet 1 Unknown at Unknown time  . fluticasone (FLOVENT HFA) 110 MCG/ACT inhaler INHALE 2 PUFFS BY MOUTH TWICE DAILY WITH  SPACER (Patient not taking: Reported on 03/26/2019) 36 g 2 Unknown at Unknown time  . lubiprostone (AMITIZA) 8 MCG capsule Take 1 capsule (8 mcg total) by mouth 2 (two) times daily with a meal. (Patient not taking: Reported on 02/20/2018) 60 capsule 3 Unknown at Unknown time  . montelukast (SINGULAIR) 10 MG tablet Take 1 tablet (10 mg total) by mouth at bedtime. (Patient not taking: Reported on 03/26/2019) 90 tablet 0 Unknown at Unknown time  . Triamcinolone Acetonide (TRIAMCINOLONE 0.1 % CREAM : EUCERIN) CREA Apply 1 application topically 2 (two) times daily as needed for rash or itching. (Patient not taking: Reported on 03/26/2019) 500 each 1 Unknown at Unknown time  . triamcinolone cream (KENALOG) 0.1 % Apply 1 application topically 2 (two) times daily. Up to 14 days at the time. (Patient not taking: Reported on 03/26/2019) 30 g 1 Unknown at Unknown time    Musculoskeletal: Strength & Muscle Tone: within normal limits Gait & Station: normal Patient leans: N/A  Psychiatric  Specialty Exam: Physical Exam  Constitutional: He is oriented to person, place, and time. He appears well-developed and well-nourished. No distress.  HENT:  Head: Normocephalic and atraumatic.  Right Ear: External ear normal.  Left Ear: External ear normal.  Eyes: Pupils are equal, round, and reactive to light. Right eye exhibits no discharge. Left eye exhibits no discharge.  Respiratory: Effort normal. No respiratory distress.  Musculoskeletal:        General: Normal range of motion.  Neurological: He is alert and oriented to person, place, and time.  Skin: He is not diaphoretic.    Review of Systems  Constitutional: Negative.   HENT: Negative.   Respiratory: Negative.   Cardiovascular: Negative.   Gastrointestinal: Negative.   Musculoskeletal: Negative.   Skin: Negative.   Neurological: Negative.   Psychiatric/Behavioral: Positive for agitation, confusion, decreased concentration, dysphoric mood and sleep disturbance. Negative for suicidal ideas. The patient is nervous/anxious.   All other systems reviewed and are negative.   Blood pressure 119/72, pulse (!) 113, temperature 98.6 F (37 C), temperature source Oral, resp. rate 16.There is no height or weight on file to calculate BMI.  General Appearance: Fairly Groomed  Eye Contact:  Fair  Speech:  Clear and Coherent and Normal Rate  Volume:  Normal  Mood:  Anxious and Depressed  Affect:  Blunt  Thought Process:  Disorganized  Orientation:  Other:  Disoriented to time  Thought Content:  Illogical  Suicidal Thoughts:  Denies  Homicidal Thoughts:  Denies  Memory:  Immediate;   Fair Recent;   Fair Remote;   Fair  Judgement:  Impaired  Insight:  Lacking  Psychomotor Activity:  Normal  Concentration:  Concentration: Fair and Attention Span: Fair  Recall:  [employer] of Knowledge:  Fair  Language:  Good  Akathisia:  Negative  Handed:  Right  AIMS (if indicated):     Assets:  Leisure Time Physical Health  ADL's:   Intact  Cognition:  WNL  Sleep:         Treatment Plan Summary: Daily contact with patient to assess and evaluate symptoms and progress in treatment and Medication  management  Observation Level/Precautions:  15 minute checks Laboratory:  CBC Chemistry Profile UDS Psychotherapy:  Individual Medications:   Hydroxyzine 25 mg TID prn for anxiety Seroquel 50 mg QHS for insomnia Cymbalta 60 mg daily for MDD Singulair 10 mg daily for asthma Flovent 10 mcg INH 2 puffs BID for asthma  Agitation: Zyprexa ODT 10 mg once prn or Zyprexa 10 mg intramuscular  once prn  Consultations:  Social work Discharge Concerns:  Safety, medication adherence Estimated LOS: Other:      Jackelyn Poling, NP 3/27/20216:46 AM

## 2019-04-12 NOTE — Progress Notes (Signed)
[facility] MD Progress Note  04/12/2019 1:51 PM Jeffrey Hall  MRN:  [id number] Subjective: Patient assessed by nurse practitioner along with Dr. Jannifer Franklin.  Patient alert and oriented.  Patient appears paranoid states I want help but you can help me, nobody can help me."  Patient refused to participate in assessment questions regarding suicidality and homicidality.  Patient continues to question this Clinical research associate, states "do you know, do you want me to teach you?" Patient gives verbal consent to reach out to his cousin Anassou Banna. Patient's cousin reports "patient is not mentally stable and he is not reasoning straight, I think he is doing cocaine."  Per cousin patient stopped driving in the middle of the highway approximately 2 weeks ago and also was arrested for trespassing and spent 3 days in jail.  Per cousin patient has hallucinations and has made threats toward others.  Per cousin patient lives in [place] with a roommate in an apartment but stopped working about 2 months ago, as a Electrical engineer, and quit school around the same time.  Patient is not actually this person's cousin and we do not have any information regarding family history.  Patient came to the [place] about 2 to 3 years ago and is from the country of [place]. Principal Problem: Substance-induced psychotic disorder (HCC) Diagnosis: Principal Problem:   Substance-induced psychotic disorder (HCC)  Total Time spent with patient: 30 minutes  Past Psychiatric History: Substance-induced psychotic disorder, generalized anxiety disorder, MDD  Past Medical History:  Past Medical History:  Diagnosis Date  . Asthma   . Eczema   . Frequent headaches   . Tuberculosis   . Urticaria     Past Surgical History:  Procedure Laterality Date  . NO PAST SURGERIES     Family History:  Family History  Problem Relation Age of Onset  . Cancer Neg Hx   . Heart disease Neg Hx   . Hypertension Neg Hx   . Hyperlipidemia Neg Hx   .  Depression Neg Hx    Family Psychiatric  History: Unknown Social History:  Social History   Substance and Sexual Activity  Alcohol Use No     Social History   Substance and Sexual Activity  Drug Use No    Social History   Socioeconomic History  . Marital status: Single    Spouse name: Not on file  . Number of children: Not on file  . Years of education: Not on file  . Highest education level: Not on file  Occupational History  . Occupation: STUDENT     Comment: [employer]  Tobacco Use  . Smoking status: Never Smoker  . Smokeless tobacco: Never Used  Substance and Sexual Activity  . Alcohol use: No  . Drug use: No  . Sexual activity: Not Currently  Other Topics Concern  . Not on file  Social History Narrative  . Not on file   Social Determinants of Health   Financial Resource Strain:   . Difficulty of Paying Living Expenses:   Food Insecurity:   . Worried About Programme researcher, broadcasting/film/video in the Last Year:   . Barista in the Last Year:   Transportation Needs:   . Freight forwarder (Medical):   Marland Kitchen Lack of Transportation (Non-Medical):   Physical Activity:   . Days of Exercise per Week:   . Minutes of Exercise per Session:   Stress:   . Feeling of Stress :   Social Connections:   . Frequency of  Communication with Friends and Family:   . Frequency of Social Gatherings with Friends and Family:   . Attends Religious Services:   . Active Member of Clubs or Organizations:   . Attends Banker Meetings:   Marland Kitchen Marital Status:    Additional Social History:                         Sleep: Fair  Appetite:  Fair  Current Medications: Current Facility-Administered Medications  Medication Dose Route Frequency Provider Last Rate Last Admin  . acetaminophen (TYLENOL) tablet 650 mg  650 mg Oral Q6H PRN Jackelyn Poling, NP      . alum & mag hydroxide-simeth (MAALOX/MYLANTA) 200-200-20 MG/5ML suspension 30 mL  30 mL Oral Q4H PRN Nira Conn A, NP       . fluticasone (FLOVENT HFA) 110 MCG/ACT inhaler 2 puff  2 puff Inhalation BID Nira Conn A, NP   2 puff at 04/12/19 4098  . hydrOXYzine (ATARAX/VISTARIL) tablet 25 mg  25 mg Oral BID Akintayo, Mojeed, MD      . magnesium hydroxide (MILK OF MAGNESIA) suspension 30 mL  30 mL Oral Daily PRN Nira Conn A, NP      . montelukast (SINGULAIR) tablet 10 mg  10 mg Oral QHS Nira Conn A, NP      . OLANZapine (ZYPREXA) injection 10 mg  10 mg Intramuscular Once PRN Jackelyn Poling, NP       Or  . OLANZapine zydis (ZYPREXA) disintegrating tablet 10 mg  10 mg Oral Once PRN Nira Conn A, NP      . risperiDONE (RISPERDAL) tablet 1 mg  1 mg Oral BID Thedore Mins, MD        Lab Results:  Results for orders placed or performed during the hospital encounter of 04/11/19 (from the past 48 hour(s))  Respiratory Panel by RT PCR (Flu A&B, Covid) - Nasopharyngeal Swab     Status: None   Collection Time: 04/12/19  1:32 AM   Specimen: Nasopharyngeal Swab  Result Value Ref Range   SARS Coronavirus 2 by RT PCR NEGATIVE NEGATIVE    Comment: (NOTE) SARS-CoV-2 target nucleic acids are NOT DETECTED. The SARS-CoV-2 RNA is generally detectable in upper respiratoy specimens during the acute phase of infection. The lowest concentration of SARS-CoV-2 viral copies this assay can detect is 131 copies/mL. A negative result does not preclude SARS-Cov-2 infection and should not be used as the sole basis for treatment or other patient management decisions. A negative result may occur with  improper specimen collection/handling, submission of specimen other than nasopharyngeal swab, presence of viral mutation(s) within the areas targeted by this assay, and inadequate number of viral copies (<131 copies/mL). A negative result must be combined with clinical observations, patient history, and epidemiological information. The expected result is Negative. Fact Sheet for Patients:   https://www.moore.com/ Fact Sheet for Healthcare Providers:  https://www.young.biz/ This test is not yet ap proved or cleared by the [place] FDA and  has been authorized for detection and/or diagnosis of SARS-CoV-2 by FDA under an Emergency Use Authorization (EUA). This EUA will remain  in effect (meaning this test can be used) for the duration of the COVID-19 declaration under Section 564(b)(1) of the Act, 21 U.S.C. section 360bbb-3(b)(1), unless the authorization is terminated or revoked sooner.    Influenza A by PCR NEGATIVE NEGATIVE   Influenza B by PCR NEGATIVE NEGATIVE    Comment: (NOTE) The Xpert Xpress SARS-CoV-2/FLU/RSV  assay is intended as an aid in  the diagnosis of influenza from Nasopharyngeal swab specimens and  should not be used as a sole basis for treatment. Nasal washings and  aspirates are unacceptable for Xpert Xpress SARS-CoV-2/FLU/RSV  testing. Fact Sheet for Patients: https://www.moore.com/ Fact Sheet for Healthcare Providers: https://www.young.biz/ This test is not yet approved or cleared by the [place] FDA and  has been authorized for detection and/or diagnosis of SARS-CoV-2 by  FDA under an Emergency Use Authorization (EUA). This EUA will remain  in effect (meaning this test can be used) for the duration of the  Covid-19 declaration under Section 564(b)(1) of the Act, 21  U.S.C. section 360bbb-3(b)(1), unless the authorization is  terminated or revoked. Performed at [facility], [address] [place], [place] [address]   CBC     Status: None   Collection Time: 04/12/19  7:34 AM  Result Value Ref Range   WBC 4.9 4.0 - 10.5 K/uL   RBC 5.37 4.22 - 5.81 MIL/uL   Hemoglobin 15.7 13.0 - 17.0 g/dL   HCT 46.9 62.9 - 52.8 %   MCV 88.1 80.0 - 100.0 fL   MCH 29.2 26.0 - 34.0 pg   MCHC 33.2 30.0 - 36.0 g/dL   RDW 41.3 24.4 - 01.0 %   Platelets 319  150 - 400 K/uL   nRBC 0.0 0.0 - 0.2 %    Comment: Performed at [facility], [address], [place], [place] [address]  Comprehensive metabolic panel     Status: Abnormal   Collection Time: 04/12/19  7:34 AM  Result Value Ref Range   Sodium 140 135 - 145 mmol/L   Potassium 3.3 (L) 3.5 - 5.1 mmol/L   Chloride 105 98 - 111 mmol/L   CO2 25 22 - 32 mmol/L   Glucose, Bld 105 (H) 70 - 99 mg/dL    Comment: Glucose reference range applies only to samples taken after fasting for at least 8 hours.   BUN 8 6 - 20 mg/dL   Creatinine, Ser 6.64 0.61 - 1.24 mg/dL   Calcium 9.3 8.9 - 40.3 mg/dL   Total Protein 8.0 6.5 - 8.1 g/dL   Albumin 4.5 3.5 - 5.0 g/dL   AST 35 15 - 41 U/L   ALT 37 0 - 44 U/L   Alkaline Phosphatase 79 38 - 126 U/L   Total Bilirubin 0.8 0.3 - 1.2 mg/dL   GFR calc non Af Amer >60 >60 mL/min   GFR calc Af Amer >60 >60 mL/min   Anion gap 10 5 - 15    Comment: Performed at [facility], [address] [place], [place] [address]    Blood Alcohol level:  Lab Results  Component Value Date   ETH <10 03/25/2019    Metabolic Disorder Labs: No results found for: HGBA1C, MPG No results found for: PROLACTIN No results found for: CHOL, TRIG, HDL, CHOLHDL, VLDL, LDLCALC  Physical Findings: AIMS: Facial and Oral Movements Muscles of Facial Expression: None, normal Lips and Perioral Area: None, normal Jaw: None, normal Tongue: None, normal,Extremity Movements Upper (arms, wrists, hands, fingers): None, normal Lower (legs, knees, ankles, toes): None, normal, Trunk Movements Neck, shoulders, hips: None, normal, Overall Severity Severity of abnormal movements (highest score from questions above): None, normal Incapacitation due to abnormal movements: None, normal Patient's awareness of abnormal movements (rate only patient's report): No Awareness, Dental Status Current problems with teeth and/or dentures?: No Does patient usually wear  dentures?: No  CIWA:  CIWA-Ar Total: 4 COWS:  COWS Total Score: 3  Musculoskeletal: Strength & Muscle Tone: within normal limits Gait & Station: normal Patient leans: N/A  Psychiatric Specialty Exam: Physical Exam  Nursing note and vitals reviewed. Constitutional: He is oriented to person, place, and time. He appears well-developed.  HENT:  Head: Normocephalic.  Cardiovascular: Normal rate.  Respiratory: Effort normal.  Musculoskeletal:     Cervical back: Normal range of motion.  Neurological: He is alert and oriented to person, place, and time.  Psychiatric: His speech is normal. His mood appears anxious. His affect is labile. He is aggressive. Thought content is paranoid. Cognition and memory are normal. He expresses impulsivity.    Review of Systems  Constitutional: Negative.   HENT: Negative.   Eyes: Negative.   Respiratory: Negative.   Cardiovascular: Negative.   Gastrointestinal: Negative.   Genitourinary: Negative.   Musculoskeletal: Negative.   Skin: Negative.   Neurological: Negative.     Blood pressure 119/72, pulse (!) 113, temperature 98.6 F (37 C), temperature source Oral, resp. rate 16.There is no height or weight on file to calculate BMI.  General Appearance: Fairly Groomed  Eye Contact:  Fair  Speech:  Clear and Coherent and Normal Rate  Volume:  Normal  Mood:  Irritable  Affect:  Non-Congruent  Thought Process:  Coherent, Goal Directed and Descriptions of Associations: Intact  Orientation:  Full (Time, Place, and Person)  Thought Content:  Paranoid Ideation  Suicidal Thoughts:  No  Homicidal Thoughts:  No  Memory:  Immediate;   Good Recent;   Good Remote;   Good  Judgement:  Impaired  Insight:  Lacking  Psychomotor Activity:  Normal  Concentration:  Concentration: Good and Attention Span: Good  Recall:  Good  Fund of Knowledge:  Good  Language:  Good  Akathisia:  No  Handed:  Right  AIMS (if indicated):     Assets:  Communication  Skills Desire for Improvement Financial Resources/Insurance Housing Intimacy Leisure Time Physical Health Resilience Social Support Talents/Skills  ADL's:  Intact  Cognition:  WNL  Sleep:        Treatment Plan Summary: Daily contact with patient to assess and evaluate symptoms and progress in treatment and Medication management Hydroxyzine 25mg  PO BID PRN, Risperdal 2mg  PO BID, Zyprexa 10mg  PO or IM PRN Inpatient psychiatric treatment recommended, will reassess tomorrow.  Emmaline Kluver, FNP 04/12/2019, 1:51 PM

## 2019-04-12 NOTE — Progress Notes (Signed)
Patient pacing in hallway. Yelling obscenities toward male staff. States that he is going to [employer] male staff members. States "kill me, I want to die so I can go to heaven and see my wife." States that he is going to burst down the doors. Multiple security and staff members responded and assisted with placement in restraint chair.

## 2019-04-12 NOTE — Progress Notes (Signed)
Patient ID: Jeffrey Hall, male   DOB: 20-Sep-1995, 24 y.o.   MRN: [id number] Pt A&O x 4, remains labile, cooperative but guarded.  Denies SI or HI, no AVH noted.  Monitoring for safety.

## 2019-04-12 NOTE — Progress Notes (Signed)
Patient meets criteria for inpatient treatment per Berneice Heinrich, NP. No appropriate beds at [facility] currently. CSW faxed referrals to the following facilities for review:  [facility]   [facility]  [facility]  [facility]  [facility]  [facility]  [facility]  [facility]  [facility]  [facility]  [facility]  [facility]   [facility]  TTS will continue to seek bed placement.     Ruthann Cancer MSW, [facility] Clin[facility] Social Worker Disposition  [facility] Ph: [phone] Fax: [phone] 04/12/2019 12:04 PM

## 2019-04-12 NOTE — Progress Notes (Signed)
Pt released from restraint chair and transferred to bed, with assist of male staff members.  Pt cooperative and tolerated well, sleeping at present, no distress noted, skin color good, respirations even and unlabored.  Pt refused vital signs.  Remains on 1-1 monitor.  MHT David at bedside.

## 2019-04-12 NOTE — Progress Notes (Signed)
Pt remains sedated at present, Q 15 min rounding in progress, respirations even & unlabored, no distress noted, calm & cooperative at present,  Skin color good.  Will continue to monitor.

## 2019-04-13 MED ORDER — RISPERIDONE 1 MG PO TABS: 1.0000 mg | ORAL_TABLET | Freq: Two times a day (BID) | ORAL | 0 refills | Status: AC

## 2019-04-13 MED ORDER — HALOPERIDOL LACTATE 5 MG/ML IJ SOLN: 5.0000 mg | Freq: Four times a day (QID) | INTRAMUSCULAR | Status: DC | PRN

## 2019-04-13 MED ORDER — HYDROXYZINE HCL 25 MG PO TABS: 25.0000 mg | ORAL_TABLET | Freq: Two times a day (BID) | ORAL | 0 refills | Status: AC

## 2019-04-13 MED ORDER — HALOPERIDOL 5 MG PO TABS
5.0000 mg | ORAL_TABLET | Freq: Four times a day (QID) | ORAL | Status: DC | PRN
  Administered 2019-04-13: 5 mg via ORAL
  Filled 2019-04-13: qty 1

## 2019-04-13 NOTE — CIRT (Signed)
Post Seclusion/Restraint Episode Mini Treatment Team  Date of Seclusion or Restraint Episode: 04/12/2019 Today's Date:  04/12/2019  List of Patient Triggers/Skill Deficits: Desire to be Discharged Home  Review of Medications:  Current Facility-Administered Medications  Medication Dose Route Frequency Provider Last Rate Last Admin  . acetaminophen (TYLENOL) tablet 650 mg  650 mg Oral Q6H PRN Nira Conn A, NP      . alum & mag hydroxide-simeth (MAALOX/MYLANTA) 200-200-20 MG/5ML suspension 30 mL  30 mL Oral Q4H PRN Nira Conn A, NP      . fluticasone (FLOVENT HFA) 110 MCG/ACT inhaler 2 puff  2 puff Inhalation BID Nira Conn A, NP   2 puff at 04/12/19 0806  . haloperidol (HALDOL) tablet 5 mg  5 mg Oral Q6H PRN Nira Conn A, NP       Or  . haloperidol lactate (HALDOL) injection 5 mg  5 mg Intramuscular Q6H PRN Nira Conn A, NP      . hydrOXYzine (ATARAX/VISTARIL) tablet 25 mg  25 mg Oral BID Jannifer Franklin, Mojeed, MD   25 mg at 04/12/19 1605  . LORazepam (ATIVAN) 2 MG/ML injection           . magnesium hydroxide (MILK OF MAGNESIA) suspension 30 mL  30 mL Oral Daily PRN Nira Conn A, NP      . montelukast (SINGULAIR) tablet 10 mg  10 mg Oral QHS Nira Conn A, NP      . risperiDONE (RISPERDAL) tablet 1 mg  1 mg Oral BID Akintayo, Mojeed, MD   1 mg at 04/12/19 1605    Compliant with Medications:  Yes  Need for Medication Adjustment:  Yes  Plan to Prevent Future Episodes of Seclusion and Restraint: Offer PRN meds when pt starts to get agitated. Staff Present:   Physician   Nurse Practitioner/PA NP Nira Conn  Pharmacist   Nurse RN Asa Saunas  Nurse [facility]  Nurse RN Midge Aver  MHT/NT MHT Charna Busman  Counselor/Case Manager   Department Leadership        Hansel Starling Lenett 04/13/2019, 5:41 AM

## 2019-04-13 NOTE — [facility] Suicide Risk Assessment (Cosign Needed)
Suicide Risk Assessment  Discharge Assessment   [facility] Discharge Suicide Risk Assessment   Principal Problem: Substance-induced psychotic disorder [facility]) Discharge Diagnoses: Principal Problem:   Substance-induced psychotic disorder (HCC)   Total Time spent with patient: 20 minutes  Musculoskeletal: Strength & Muscle Tone: within normal limits Gait & Station: normal Patient leans: N/A  Psychiatric Specialty Exam:   Blood pressure (!) 158/92, pulse (!) 114, temperature 98.5 F (36.9 C), temperature source Oral, resp. rate 20, SpO2 100 %.There is no height or weight on file to calculate BMI.  General Appearance: Casual and Fairly Groomed  Eye Contact::  Good  Speech:  Clear and Coherent and Normal Rate409  Volume:  Normal  Mood:  Euthymic  Affect:  Congruent  Thought Process:  Coherent, Goal Directed and Descriptions of Associations: Intact  Orientation:  Full (Time, Place, and Person)  Thought Content:  Logical  Suicidal Thoughts:  No  Homicidal Thoughts:  No  Memory:  Immediate;   Good Recent;   Good Remote;   Good  Judgement:  Fair  Insight:  Fair  Psychomotor Activity:  Normal  Concentration:  Good  Recall:  Good  Fund of Knowledge:Good  Language: Good  Akathisia:  No  Handed:  Right  AIMS (if indicated):     Assets:  Communication Skills Desire for Improvement Financial Resources/Insurance Housing Intimacy Leisure Time Physical Health Resilience Social Support  Sleep:     Cognition: WNL  ADL's:  Intact   Mental Status Per Nursing Assessment::   On Admission: Patient admitted under involuntary commitment.  Patient denies suicidal and homicidal ideations, denies auditory and visual hallucinations.  Initiated Risperdal 1 mg twice daily and Vistaril 25 mg twice daily as needed.  Patient denies any side effects related to medications.  Patient reports plan to discharge home and follow-up.  Demographic Factors:  Male  Loss Factors: NA  Historical  Factors: NA  Risk Reduction Factors:   Living with another person, especially a relative, Positive social support, Positive therapeutic relationship and Positive coping skills or problem solving skills  Continued Clinical Symptoms:  Alcohol/Substance Abuse/Dependencies  Cognitive Features That Contribute To Risk:  None    Suicide Risk:  Minimal: No identifiable suicidal ideation.  Patients presenting with no risk factors but with morbid ruminations; may be classified as minimal risk based on the severity of the depressive symptoms    Plan Of Care/Follow-up recommendations:  Other:  Follow up with outpatient psychiatry  Patrcia Dolly, FNP 04/13/2019, 11:13 AM

## 2019-04-13 NOTE — Progress Notes (Signed)
Patient ID: Jeffrey Hall, male   DOB: 1995-11-17, 24 y.o.   MRN: [id number]  D: Pt alert and oriented on the unit.  A: Education, support, and encouragement provided. Pt refused review of discharge summary, medications, follow up appointments, and suicide prevention resources provided. R: Pt denies SI/HI, A/VH, pain, or any concerns at this time. Pt ambulatory on and off unit. Pt discharged to lobby.

## 2019-04-13 NOTE — Progress Notes (Addendum)
Pt placed in restraint chair with assist of security, [employer] and male staff members.   Pt given Ativan, Geodon, Benadryl injections as per NP order.  Pt tolerated well without resistance.  Pt wheeled to Seclusion room without incident.

## 2019-04-13 NOTE — Progress Notes (Signed)
Q 15 min rounding in progress, pt resting at present, no distress noted. Calm & cooperative.   Monitoring for safety.

## 2019-04-13 NOTE — Progress Notes (Signed)
Pt remains sedated, resting at present, no distress noted, calm & cooperative at present.  Skin color good, resp even & unlabored, Will continue to monitor.

## 2019-04-13 NOTE — Discharge Summary (Addendum)
Physician Discharge Summary Note  Patient:  Jeffrey Hall is an 24 y.o., male MRN:  [id number] DOB:  Dec 17, 1995 Patient phone:  [phone] (home)  Patient address:   [address] [place] [address],  Total Time spent with patient: 30 minutes  Date of Admission:  04/11/2019 Date of Discharge: 04/13/2019  Reason for Admission:  Patient admitted under involuntary commitment, patient reports "family is concerned."   Principal Problem: Substance-induced psychotic disorder [facility]) Discharge Diagnoses: Principal Problem:   Substance-induced psychotic disorder ([place])   Past Psychiatric History: Substance-induced psychotic disorder, MDD, generalized anxiety disorder  Past Medical History:  Past Medical History:  Diagnosis Date   Asthma    Eczema    Frequent headaches    Tuberculosis    Urticaria     Past Surgical History:  Procedure Laterality Date   NO PAST SURGERIES     Family History:  Family History  Problem Relation Age of Onset   Cancer Neg Hx    Heart disease Neg Hx    Hypertension Neg Hx    Hyperlipidemia Neg Hx    Depression Neg Hx    Family Psychiatric  History: Unknown Social History:  Social History   Substance and Sexual Activity  Alcohol Use No     Social History   Substance and Sexual Activity  Drug Use No    Social History   Socioeconomic History   Marital status: Single    Spouse name: Not on file   Number of children: Not on file   Years of education: Not on file   Highest education level: Not on file  Occupational History   Occupation: STUDENT     Comment: [employer]  Tobacco Use   Smoking status: Never Smoker   Smokeless tobacco: Never Used  Substance and Sexual Activity   Alcohol use: No   Drug use: No   Sexual activity: Not Currently  Other Topics Concern   Not on file  Social History Narrative   Not on file   Social Determinants of Health   Financial Resource Strain:    Difficulty of Paying Living Expenses:    Food Insecurity:    Worried About Charity fundraiser in the Last Year:    Arboriculturist in the Last Year:   Transportation Needs:    Film/video editor (Medical):    Lack of Transportation (Non-Medical):   Physical Activity:    Days of Exercise per Week:    Minutes of Exercise per Session:   Stress:    Feeling of Stress :   Social Connections:    Frequency of Communication with Friends and Family:    Frequency of Social Gatherings with Friends and Family:    Attends Religious Services:    Active Member of Clubs or Organizations:    Attends Archivist Meetings:    Marital Status:     Hospital Course:  On Admission: Patient admitted under involuntary commitment.  Patient denies suicidal and homicidal ideations, denies auditory and visual hallucinations.  Initiated Risperdal 1 mg twice daily and Vistaril 25 mg twice daily as needed.  Patient denies any side effects related to medications.  Patient reports plan to discharge home and follow-up.  Physical Findings: AIMS: Facial and Oral Movements Muscles of Facial Expression: None, normal Lips and Perioral Area: None, normal Jaw: None, normal Tongue: None, normal,Extremity Movements Upper (arms, wrists, hands, fingers): None, normal Lower (legs, knees, ankles, toes): None, normal, Trunk Movements Neck, shoulders, hips: None,  normal, Overall Severity Severity of abnormal movements (highest score from questions above): None, normal Incapacitation due to abnormal movements: None, normal Patient's awareness of abnormal movements (rate only patient's report): No Awareness, Dental Status Current problems with teeth and/or dentures?: No Does patient usually wear dentures?: No  CIWA:  CIWA-Ar Total: 1 COWS:  COWS Total Score: 2  Musculoskeletal: Strength & Muscle Tone: within normal limits Gait & Station: normal Patient leans: N/A  Psychiatric Specialty Exam: Physical Exam  Nursing note and vitals  reviewed. Constitutional: He is oriented to person, place, and time. He appears well-developed.  HENT:  Head: Normocephalic.  Cardiovascular: Normal rate.  Respiratory: Effort normal.  Neurological: He is alert and oriented to person, place, and time.  Psychiatric: He has a normal mood and affect. His speech is normal and behavior is normal. Judgment and thought content normal. Cognition and memory are normal.    Review of Systems  Blood pressure (!) 158/92, pulse (!) 114, temperature 98.5 F (36.9 C), temperature source Oral, resp. rate 20, SpO2 100 %.There is no height or weight on file to calculate BMI.  General Appearance: Casual and Fairly Groomed  Eye Contact:  Good  Speech:  Clear and Coherent and Normal Rate  Volume:  Normal  Mood:  Euthymic  Affect:  Congruent  Thought Process:  Coherent, Goal Directed and Descriptions of Associations: Intact  Orientation:  Full (Time, Place, and Person)  Thought Content:  WDL and Logical  Suicidal Thoughts:  No  Homicidal Thoughts:  No  Memory:  Immediate;   Good Recent;   Good Remote;   Good  Judgement:  Fair  Insight:  Fair  Psychomotor Activity:  Normal  Concentration:  Concentration: Good and Attention Span: Good  Recall:  Good  Fund of Knowledge:  Good  Language:  Good  Akathisia:  No  Handed:  Right  AIMS (if indicated):     Assets:  Communication Skills Desire for Improvement Financial Resources/Insurance Housing Intimacy Leisure Time Physical Health Resilience  ADL's:  Intact  Cognition:  WNL  Sleep:           Has this patient used any form of tobacco in the last 30 days? (Cigarettes, Smokeless Tobacco, Cigars, and/or Pipes) Yes, No  Blood Alcohol level:  Lab Results  Component Value Date   ETH <10 03/25/2019    Metabolic Disorder Labs:  No results found for: HGBA1C, MPG No results found for: PROLACTIN No results found for: CHOL, TRIG, HDL, CHOLHDL, VLDL, LDLCALC  See Psychiatric Specialty Exam and  Suicide Risk Assessment completed by Attending Physician prior to discharge.  Discharge destination:  Home  Is patient on multiple antipsychotic therapies at discharge:  No   Has Patient had three or more failed trials of antipsychotic monotherapy by history:  No  Recommended Plan for Multiple Antipsychotic Therapies: NA  Discharge Instructions     Diet - low sodium heart healthy   Complete by: As directed    Increase activity slowly   Complete by: As directed       Allergies as of 04/13/2019   No Known Allergies      Medication List     TAKE these medications      Indication  DULoxetine 60 MG capsule Commonly known as: CYMBALTA Take 2 capsules by mouth once daily  Indication: Major Depressive Disorder   fexofenadine 180 MG tablet Commonly known as: ALLEGRA Take 1 tablet (180 mg total) by mouth daily.  Indication: Hayfever   fluticasone 110 MCG/ACT  inhaler Commonly known as: Flovent HFA INHALE 2 PUFFS BY MOUTH TWICE DAILY WITH  SPACER  Indication: Asthma   hydrOXYzine 25 MG tablet Commonly known as: ATARAX/VISTARIL Take 1 tablet (25 mg total) by mouth 2 (two) times daily.  Indication: Feeling Anxious, EPS prevention/agitation   montelukast 10 MG tablet Commonly known as: SINGULAIR Take 1 tablet (10 mg total) by mouth at bedtime.  Indication: Asthma   risperiDONE 1 MG tablet Commonly known as: RISPERDAL Take 1 tablet (1 mg total) by mouth 2 (two) times daily.  Indication: Delusions, psychosis          Follow-up recommendations:  Other:  Follow up with outpatient resources  Comments:  Discharge  Signed: Patrcia Dolly, FNP 04/13/2019, 11:22 AM Patient seen face-to-face for psychiatric evaluation, chart reviewed and case discussed with the physician extender and developed treatment plan. Reviewed the information documented and agree with the treatment plan. Thedore Mins, MD

## 2019-04-13 NOTE — Progress Notes (Signed)
Pt remains sedated, sleeping, arouse able to verbal stimuli. No distress noted, calm at present. Resp even & unlabored, skin color good.  Monitoring for safety.

## 2019-05-02 ENCOUNTER — Other Ambulatory Visit: Payer: Self-pay | Admitting: Allergy

## 2019-05-02 DIAGNOSIS — J4540 Moderate persistent asthma, uncomplicated: Secondary | ICD-10-CM

## 2019-05-06 ENCOUNTER — Other Ambulatory Visit: Payer: Self-pay

## 2019-05-06 ENCOUNTER — Emergency Department (HOSPITAL_COMMUNITY)
Admission: EM | Admit: 2019-05-06 | Discharge: 2019-05-08 | Disposition: A | Discharge status: Home / Self Care | Payer: Self-pay | Attending: Emergency Medicine | Admitting: Emergency Medicine

## 2019-05-06 ENCOUNTER — Encounter (HOSPITAL_COMMUNITY): Payer: Self-pay

## 2019-05-06 DIAGNOSIS — Z79899 Other long term (current) drug therapy: Secondary | ICD-10-CM | POA: Insufficient documentation

## 2019-05-06 DIAGNOSIS — R45850 Homicidal ideations: Secondary | ICD-10-CM | POA: Insufficient documentation

## 2019-05-06 DIAGNOSIS — F1290 Cannabis use, unspecified, uncomplicated: Secondary | ICD-10-CM | POA: Insufficient documentation

## 2019-05-06 DIAGNOSIS — F333 Major depressive disorder, recurrent, severe with psychotic symptoms: Secondary | ICD-10-CM | POA: Insufficient documentation

## 2019-05-06 DIAGNOSIS — R4689 Other symptoms and signs involving appearance and behavior: Secondary | ICD-10-CM

## 2019-05-06 DIAGNOSIS — Z8709 Personal history of other diseases of the respiratory system: Secondary | ICD-10-CM | POA: Insufficient documentation

## 2019-05-06 LAB — COMPREHENSIVE METABOLIC PANEL
ALT: 34 U/L (ref 0–44)
AST: 70 U/L — ABNORMAL HIGH (ref 15–41)
Albumin: 4.2 g/dL (ref 3.5–5.0)
Alkaline Phosphatase: 69 U/L (ref 38–126)
Anion gap: 10 (ref 5–15)
BUN: 6 mg/dL (ref 6–20)
CO2: 23 mmol/L (ref 22–32)
Calcium: 9.5 mg/dL (ref 8.9–10.3)
Chloride: 108 mmol/L (ref 98–111)
Creatinine, Ser: 1.02 mg/dL (ref 0.61–1.24)
GFR calc Af Amer: >60 mL/min (ref >60)
GFR calc non Af Amer: >60 mL/min (ref >60)
Glucose, Bld: 86 mg/dL (ref 70–99)
Potassium: 3.1 mmol/L — ABNORMAL LOW (ref 3.5–5.1)
Sodium: 141 mmol/L (ref 135–145)
Total Bilirubin: 1.2 mg/dL (ref 0.3–1.2)
Total Protein: 7.1 g/dL (ref 6.5–8.1)

## 2019-05-06 LAB — CBC WITH DIFFERENTIAL/PLATELET
Abs Immature Granulocytes: 0.01 10*3/uL (ref 0.00–0.07)
Basophils Absolute: 0.1 10*3/uL (ref 0.0–0.1)
Basophils Relative: 1 %
Eosinophils Absolute: 0.2 10*3/uL (ref 0.0–0.5)
Eosinophils Relative: 4 %
HCT: 43.3 % (ref 39.0–52.0)
Hemoglobin: 14.4 g/dL (ref 13.0–17.0)
Immature Granulocytes: 0 %
Lymphocytes Relative: 28 %
Lymphs Abs: 1.8 10*3/uL (ref 0.7–4.0)
MCH: 29.4 pg (ref 26.0–34.0)
MCHC: 33.3 g/dL (ref 30.0–36.0)
MCV: 88.4 fL (ref 80.0–100.0)
Monocytes Absolute: 0.6 10*3/uL (ref 0.1–1.0)
Monocytes Relative: 9 %
Neutro Abs: 3.7 10*3/uL (ref 1.7–7.7)
Neutrophils Relative %: 58 %
Platelets: 222 10*3/uL (ref 150–400)
RBC: 4.9 MIL/uL (ref 4.22–5.81)
RDW: 12.6 % (ref 11.5–15.5)
WBC: 6.4 10*3/uL (ref 4.0–10.5)
nRBC: 0 % (ref 0.0–0.2)

## 2019-05-06 LAB — SALICYLATE LEVEL: Salicylate Lvl: <7 mg/dL — ABNORMAL LOW (ref 7.0–30.0)

## 2019-05-06 LAB — ACETAMINOPHEN LEVEL: Acetaminophen (Tylenol), Serum: <10 ug/mL — ABNORMAL LOW (ref 10–30)

## 2019-05-06 LAB — ETHANOL: Alcohol, Ethyl (B): <10 mg/dL (ref <10)

## 2019-05-06 LAB — CBG MONITORING, ED: Glucose-Capillary: 75 mg/dL (ref 70–99)

## 2019-05-06 MED ORDER — HYDROXYZINE HCL 25 MG PO TABS
25.0000 mg | ORAL_TABLET | Freq: Two times a day (BID) | ORAL | Status: DC
  Administered 2019-05-07 – 2019-05-08 (×4): 25 mg via ORAL
  Filled 2019-05-06 (×4): qty 1

## 2019-05-06 MED ORDER — ZIPRASIDONE MESYLATE 20 MG IM SOLR
10.0000 mg | Freq: Once | INTRAMUSCULAR | Status: AC
  Administered 2019-05-06: 10 mg via INTRAMUSCULAR

## 2019-05-06 MED ORDER — RISPERIDONE 1 MG PO TABS
1.0000 mg | ORAL_TABLET | Freq: Two times a day (BID) | ORAL | Status: DC
  Administered 2019-05-07: 1 mg via ORAL
  Filled 2019-05-06: qty 1

## 2019-05-06 MED ORDER — DULOXETINE HCL 60 MG PO CPEP
120.0000 mg | ORAL_CAPSULE | Freq: Every day | ORAL | Status: DC
  Administered 2019-05-07 – 2019-05-08 (×3): 120 mg via ORAL
  Filled 2019-05-06 (×3): qty 2

## 2019-05-06 MED ORDER — LORAZEPAM 2 MG/ML IJ SOLN
2.0000 mg | Freq: Once | INTRAMUSCULAR | Status: AC
  Administered 2019-05-06: 2 mg via INTRAMUSCULAR
  Filled 2019-05-06: qty 1

## 2019-05-06 MED ORDER — DROPERIDOL 2.5 MG/ML IJ SOLN: 5.0000 mg | Freq: Once | INTRAMUSCULAR | Status: DC

## 2019-05-06 NOTE — BH Assessment (Signed)
[facility] Assessment Progress Note    Patient is not currently in a state of mind where he can be assessed.  TTS will attempt to assess later when patient is more cooperative.
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
## 2019-05-06 NOTE — ED Notes (Signed)
Pt intermittently waking up, fighting against restraints and then falling back asleep, difficult to redirect when waking. At this time, pt appears to have calmed down and sleeping comfortably. Pt will be switched to soft restraints at this time.

## 2019-05-06 NOTE — ED Notes (Signed)
VIOLENT RESTRAINTS DISCONTINUED. NONVIOLENT RESTRAINTS IN THE PROCESS OF BEING APPLIED. Pt still attempting OOB, pulling and pushing at staff. Frequently falls back asleep, slightly redirectable

## 2019-05-07 ENCOUNTER — Encounter (HOSPITAL_COMMUNITY): Payer: Self-pay | Admitting: Behavioral Health

## 2019-05-07 LAB — RAPID URINE DRUG SCREEN, HOSP PERFORMED
Amphetamines: NOT DETECTED
Barbiturates: NOT DETECTED
Benzodiazepines: POSITIVE — AB
Cocaine: NOT DETECTED
Opiates: NOT DETECTED
Tetrahydrocannabinol: POSITIVE — AB

## 2019-05-07 MED ORDER — LORAZEPAM 2 MG/ML IJ SOLN: 2.0000 mg | Freq: Once | INTRAMUSCULAR | Status: AC | PRN

## 2019-05-07 MED ORDER — LORAZEPAM 1 MG PO TABS
2.0000 mg | ORAL_TABLET | Freq: Once | ORAL | Status: AC | PRN
  Administered 2019-05-07: 2 mg via ORAL
  Filled 2019-05-07: qty 2

## 2019-05-07 MED ORDER — BENZTROPINE MESYLATE 1 MG PO TABS
0.5000 mg | ORAL_TABLET | Freq: Two times a day (BID) | ORAL | Status: DC
  Administered 2019-05-07 – 2019-05-08 (×3): 0.5 mg via ORAL
  Filled 2019-05-07 (×3): qty 1

## 2019-05-07 MED ORDER — HALOPERIDOL 5 MG PO TABS
5.0000 mg | ORAL_TABLET | Freq: Two times a day (BID) | ORAL | Status: DC
  Administered 2019-05-07 – 2019-05-08 (×3): 5 mg via ORAL
  Filled 2019-05-07 (×3): qty 1

## 2019-05-07 MED ORDER — HALOPERIDOL LACTATE 5 MG/ML IJ SOLN: 5.0000 mg | Freq: Once | INTRAMUSCULAR | Status: DC | PRN

## 2019-05-07 NOTE — ED Notes (Signed)
Pt was recommended for observation based on TTS
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
## 2019-05-07 NOTE — ED Notes (Signed)
After I spoke with Dr. Juleen China he filled out IVC paperwork and I brought these to the secretary.  Jeffrey Hall is now calm again

## 2019-05-07 NOTE — ED Provider Notes (Signed)
Notified by nursing that patient trying to leave.  He was brought in for psychiatric evaluation.  Described behavior sounds very concerning to me.  He is not involuntarily committed at this time.  He was evaluated by TTS and they are recommending observing him overnight for reevaluation in the morning.  He continues to act very labile.  Given the seriousness of his reported behavior, I am involuntarily committing him at this time.   Raeford Razor, MD 05/07/19 1620

## 2019-05-07 NOTE — ED Notes (Signed)
Pt is upset and states that he is going to leave.  Pt agreed to take 2mg  ativan po,  Security notified.  Call to [facility] to speak with TTS but there was no answer.

## 2019-05-07 NOTE — ED Notes (Signed)
bfast ordered 

## 2019-05-07 NOTE — ED Notes (Signed)
Notified BH that pt needs TTS and per night sitter he has some delusional thoughts.  They state that they will prioritize doing his TTS.  Pt is sleeping for me, sitter at the bedside.  Food tray placed at the bedside.
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
## 2019-05-07 NOTE — BHH Counselor (Signed)
Per Gabriel Rung, RN pt is currently sleeping. TTS to assess pt once he is roused and able to engage. Please call once the pt is ready to be assessed.   Redmond Pulling, MS, [facility], [facility] Triage Specialist [phone].

## 2019-05-07 NOTE — ED Notes (Signed)
Spoke with TTS again and they state that he is a priority and they will get him evaluated as soon as possible

## 2019-05-08 NOTE — ED Provider Notes (Signed)
Cleared for outpatient mgmt by Pyschiatry services.  IVC rescinded.  Patient stable for discharge.  I confirmed with his nurse elizabeth preyer that the patient is NOT under arrest with [employer].  Disposition: Patient denies SI, HI and AVH. He  Is calm during this evbaluatoopn and does not appear psychotic. He does have a psychiatric history to include substance induced psychotic disorder and aggression. He is on a number of psychotropic medications which were restarted while in the ED. He receives outpatient psychiatric services at [facility]. At this time, he does not meet criteria for inpatient psychiatric hospitalization. He is psychiatrically cleared with recommendation to continue medications as prescribed and to continue outpatient psychiatric services at [facility].   I discussed with patients roommate the following safety plan;  If the patient's symptoms worsen or do not continue to improve or if the patient becomes actively suicidal or homicidal then it is recommended that the patient return to the closest hospital emergency room or call 911 for further evaluation and treatment. National Suicide Prevention Lifeline 1800-SUICIDE or (774) 370-3611. Roommate was educated about removing/locking any firearms, medications or dangerous products from the home.  EDP, Dr.Dayzha Pogosyan updated on disposition    Terald Sleeper, MD 05/08/19 [phone]
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
## 2019-05-08 NOTE — ED Provider Notes (Signed)
No acute complaints overnight.  Patient's vital signs reviewed and unremarkable.  Patient was pending reassessment by TTS this morning after IVC paperwork was filled out yesterday.   Per our nursing staff, [facility] : [facility] team has re-evaluated the patient and are anticipated to recommend rescinding IVC and discharge with outpatient follow up.  If this is the case today, we will do so.   Terald Sleeper, MD 05/08/19 1014

## 2019-05-08 NOTE — ED Notes (Signed)
Lunch Tray Ordered @ 1043. 

## 2019-05-08 NOTE — ED Notes (Signed)
bfast ordered 

## 2019-06-15 IMAGING — CT CT ABD-PELV W/ CM
2 of 4 series · 16 of 46 positions shown, 18 images · IV contrast (ISOVUE 300)
Comparison: None.

CLINICAL DATA: Mid abdominal pain and nausea for 2 years.

EXAM:
CT ABDOMEN AND PELVIS WITH CONTRAST
TECHNIQUE: Multidetector CT imaging of the abdomen and pelvis was performed
using the standard protocol following bolus administration of
intravenous contrast.
CONTRAST:  100mL [id number] IOPAMIDOL ([id number]) INJECTION 61%

[Series 2: abd/pel w · axial · 0.66mm/px · z∈[-494,-74]mm · 13 of 92 slices shown, 15 images]
[im 4/92  soft-tissue]
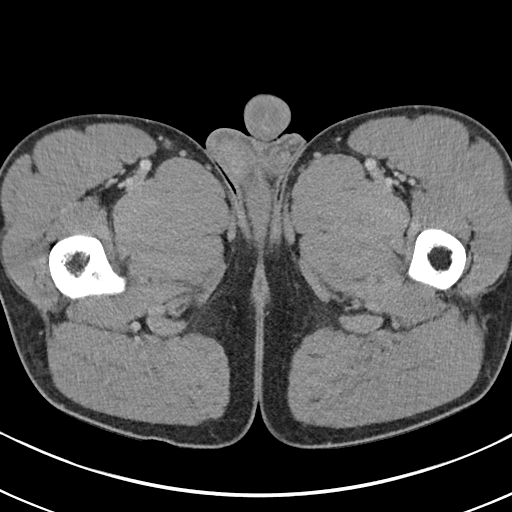
[im 4/92  bone]
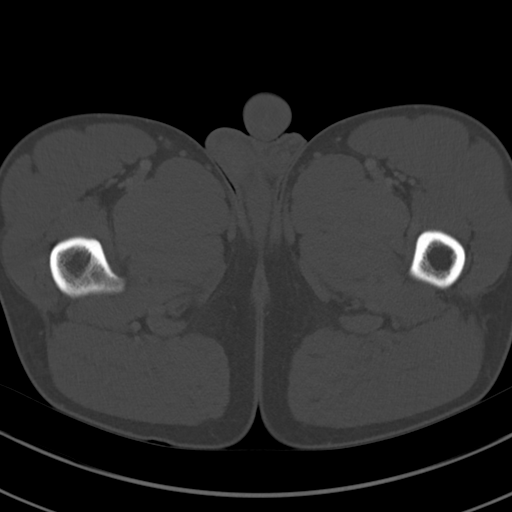
[im 12/92  soft-tissue]
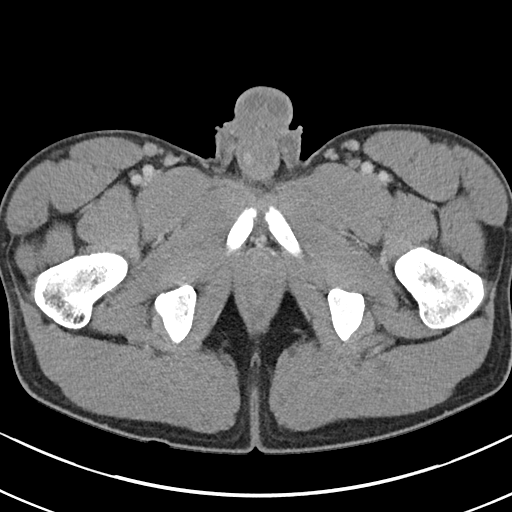
[im 19/92  soft-tissue]
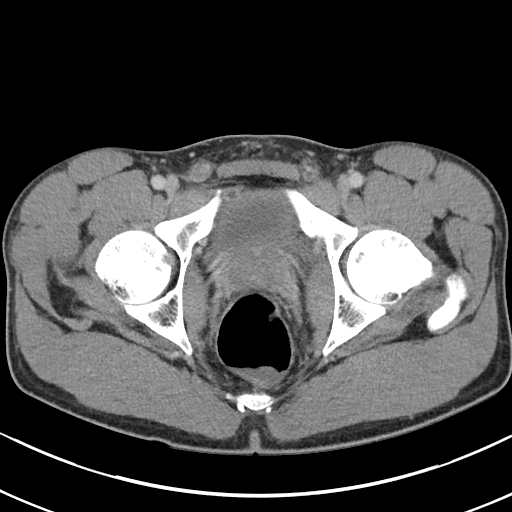
[im 27/92  soft-tissue]
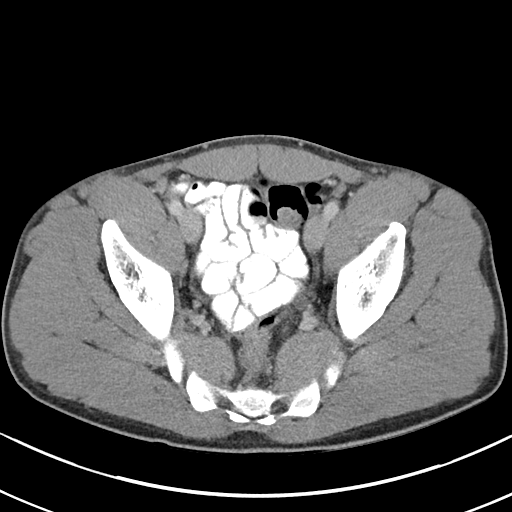
[im 31/92  soft-tissue]
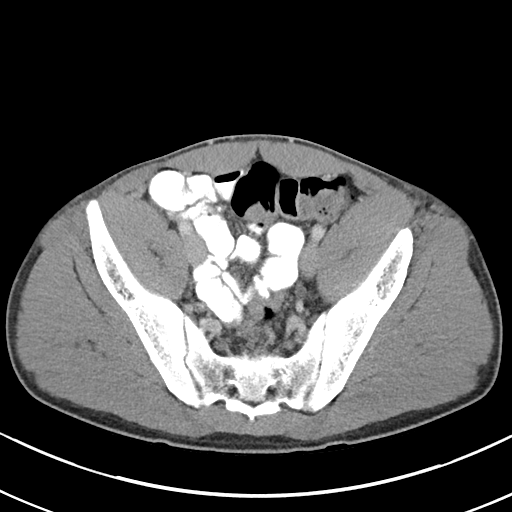
[im 38/92  soft-tissue]
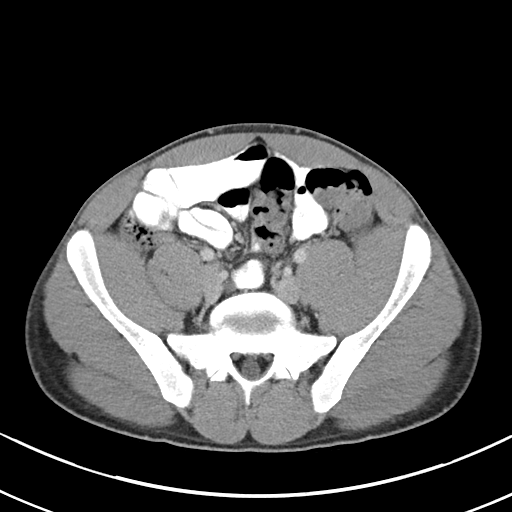
[im 46/92  soft-tissue]
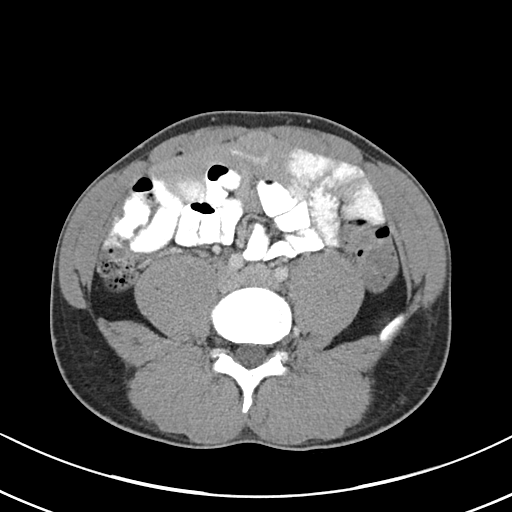
[im 54/92  soft-tissue]
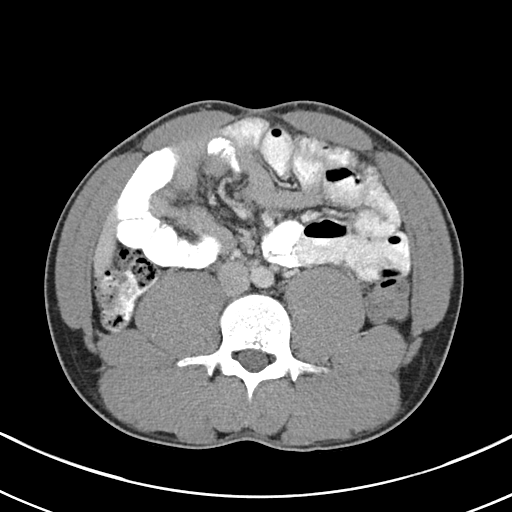
[im 61/92  soft-tissue]
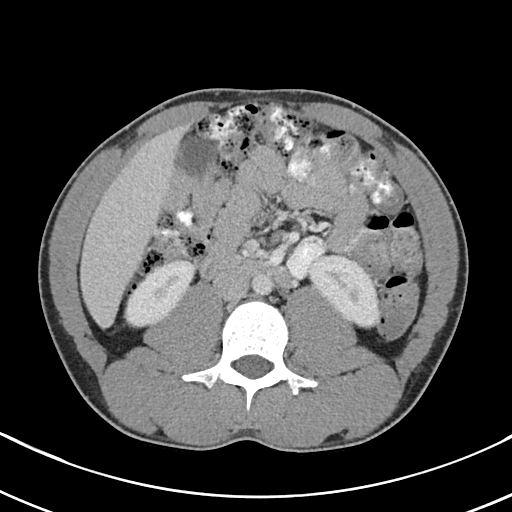
[im 61/92  bone]
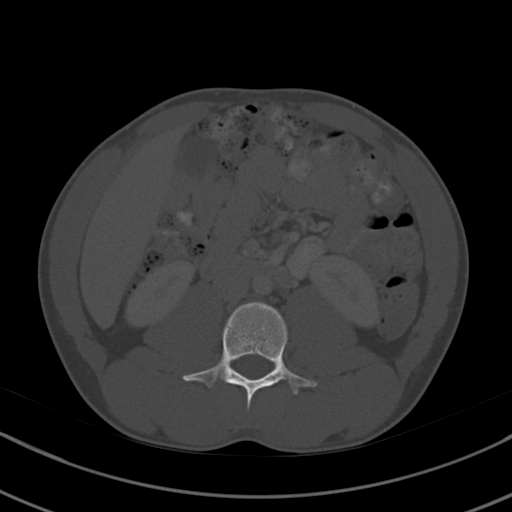
[im 65/92  soft-tissue]
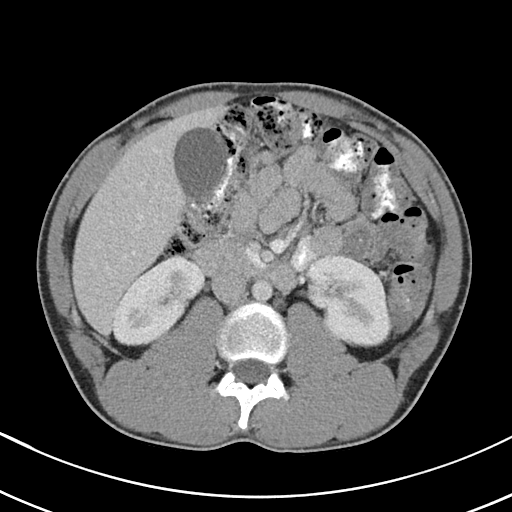
[im 73/92  soft-tissue]
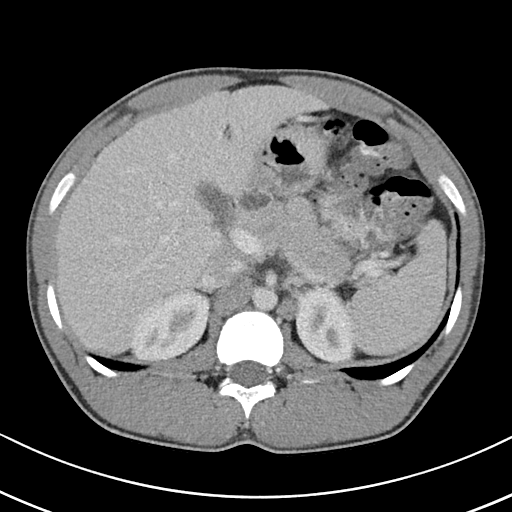
[im 80/92  soft-tissue]
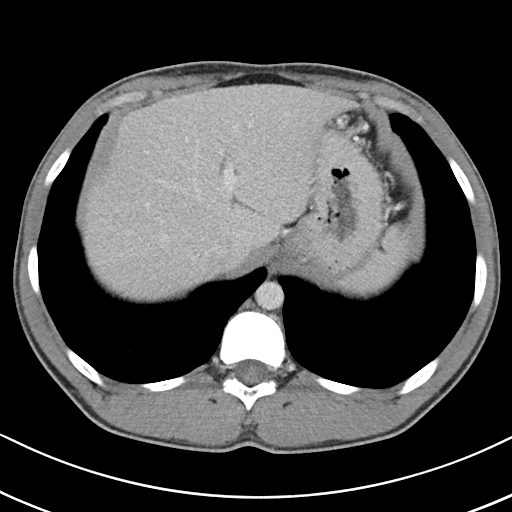
[im 88/92  soft-tissue]
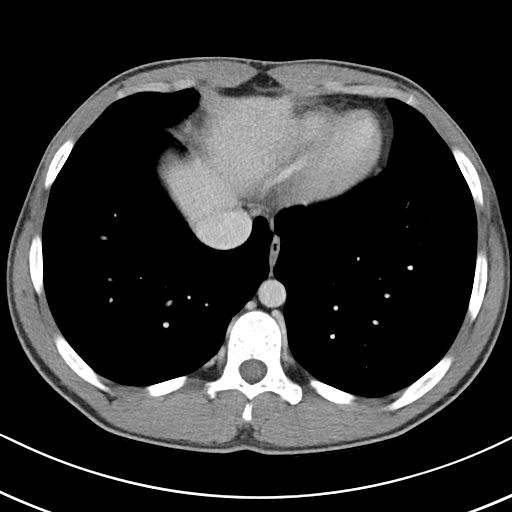

[Series 5: abd/pel w st · coronal · 0.71mm/px · 3 of 84 slices shown]
[im 28/84  soft-tissue]
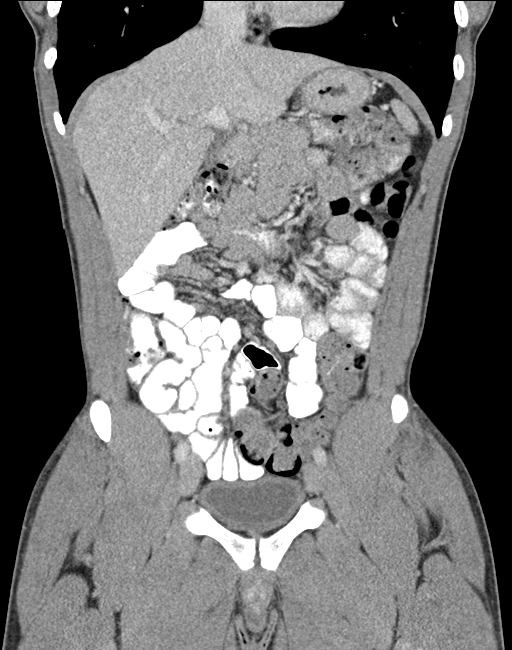
[im 37/84  soft-tissue]
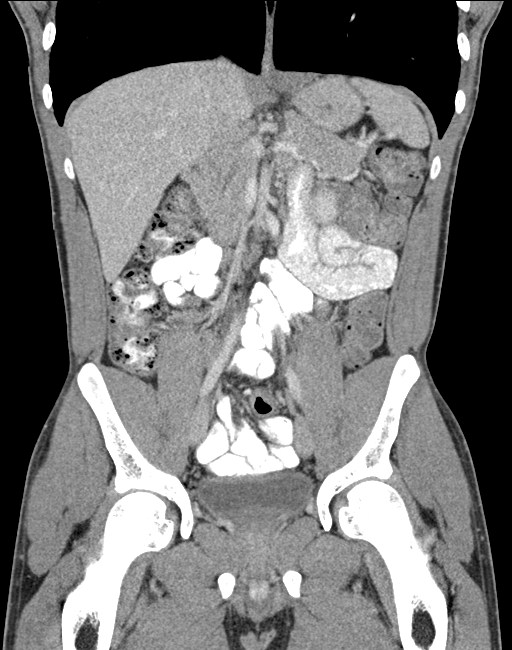
[im 47/84  soft-tissue]
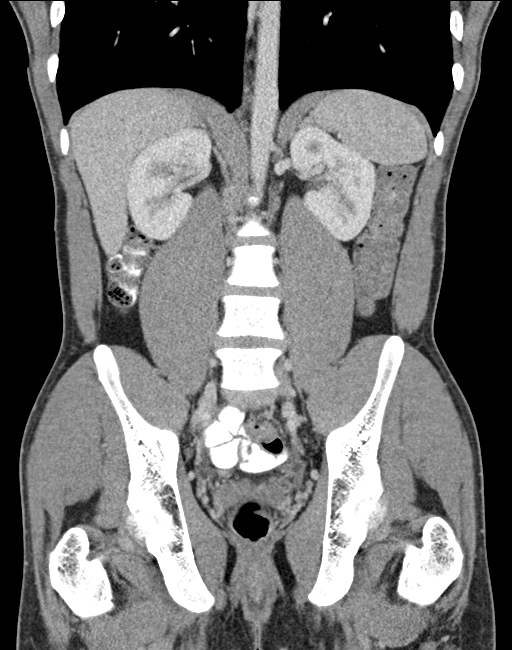

[16 of 46 positions shown; findings below may reference images not displayed]

FINDINGS: Lower chest: Unremarkable

Hepatobiliary: Unremarkable

Pancreas: Unremarkable

Spleen: Unremarkable

Adrenals/Urinary Tract: 5 mm hypodense lesion in the right mid
kidney on image [DATE], statistically likely to be a small cyst but
technically nonspecific due to small size. Adrenal glands normal.
Unremarkable

Stomach/Bowel: Borderline prominence of stool in the colon.

Vascular/Lymphatic: Unremarkable

Reproductive: Unremarkable

Other: No supplemental non-categorized findings.

Musculoskeletal: Unremarkable
IMPRESSION: Borderline appearance for constipation. Otherwise, a cause for the
patient's chronic abdominal pain is not identified.

## 2019-07-02 IMAGING — DX DG CHEST 2V
2 series · 2 of 2 positions shown · non-contrast
Comparison: CT Abdomen and Pelvis 07/13/2017. Chest radiographs
07/21/2016.

CLINICAL DATA: 21-year-old male with positive QuantiFERON TB test.

EXAM:
CHEST - 2 VIEW

[chest pa]
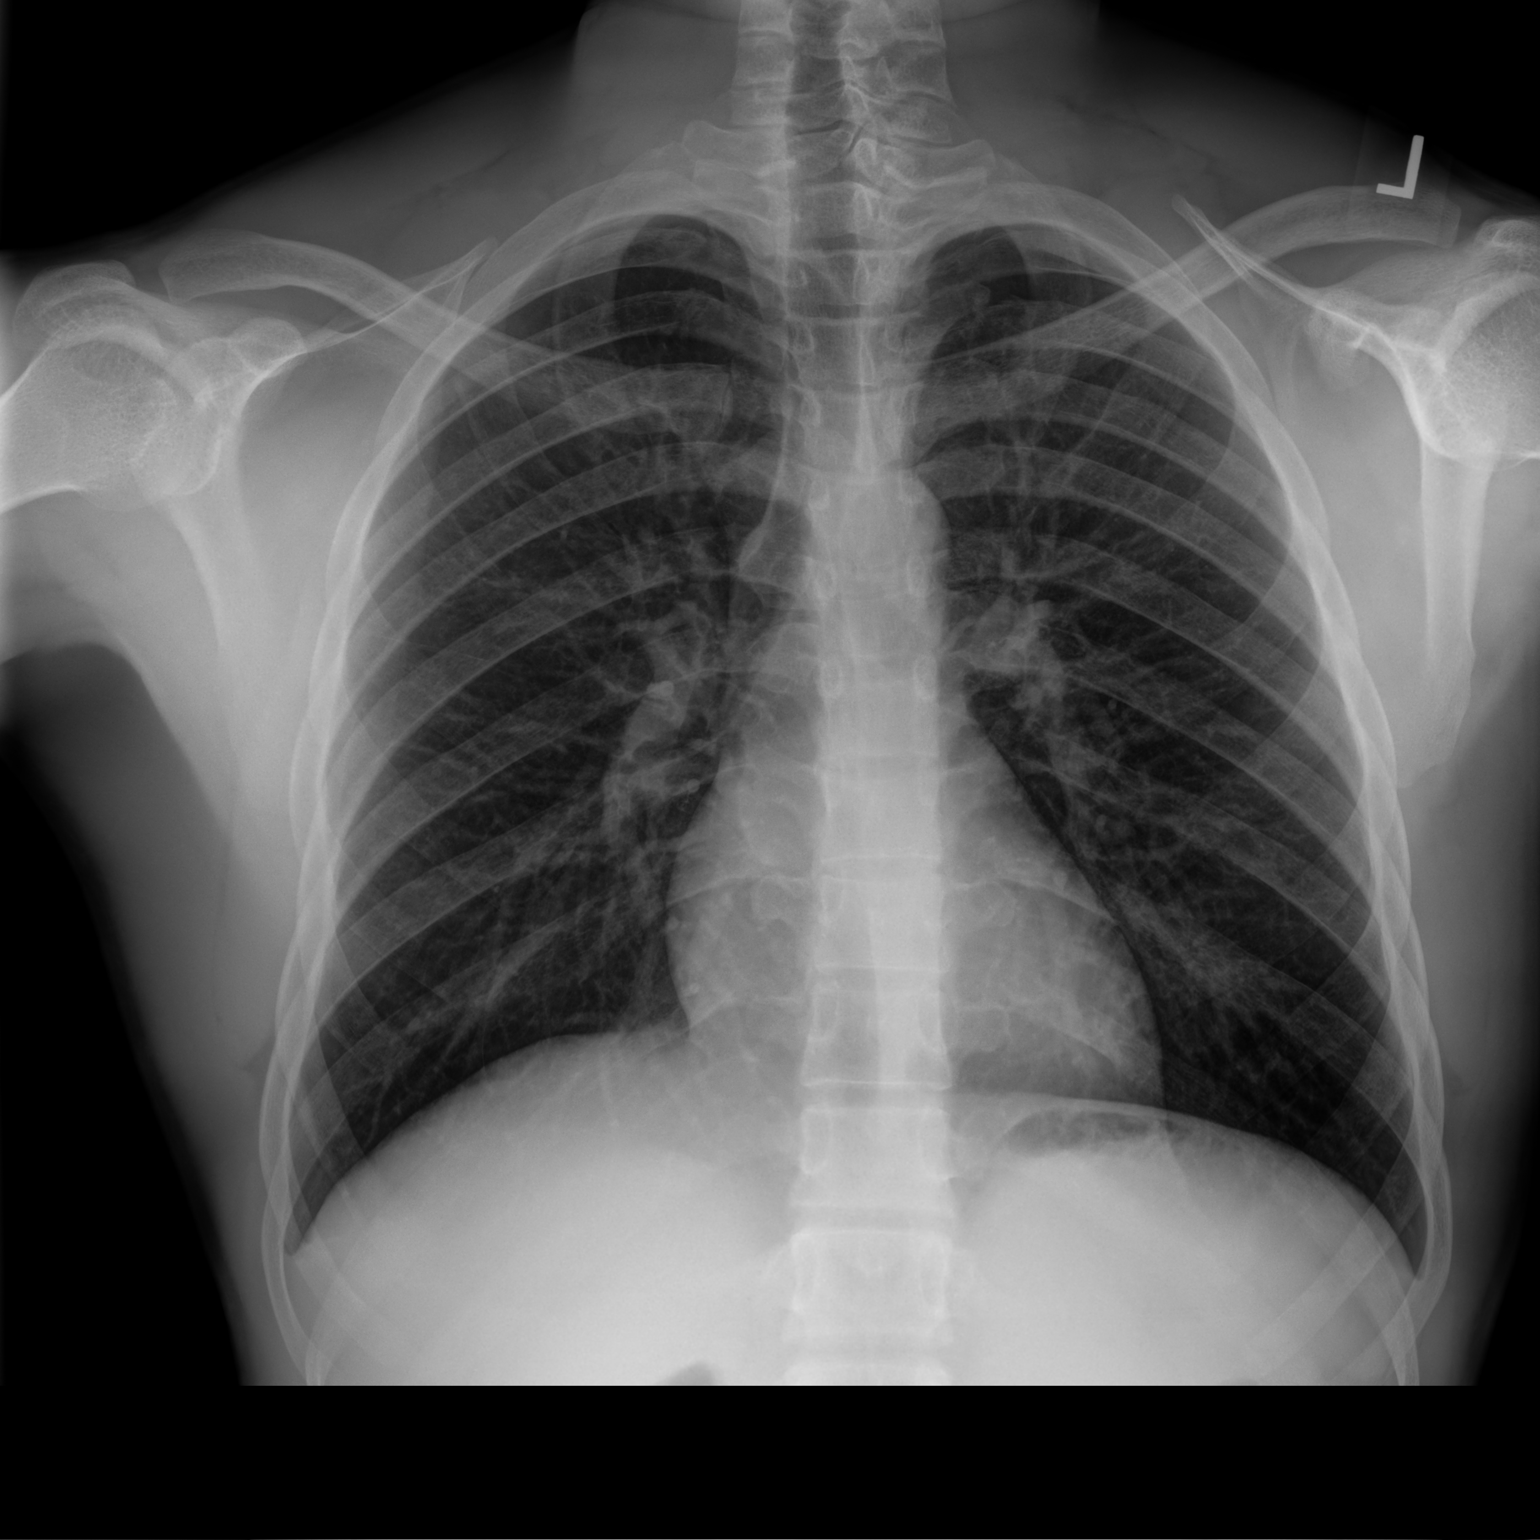

[chest lat]
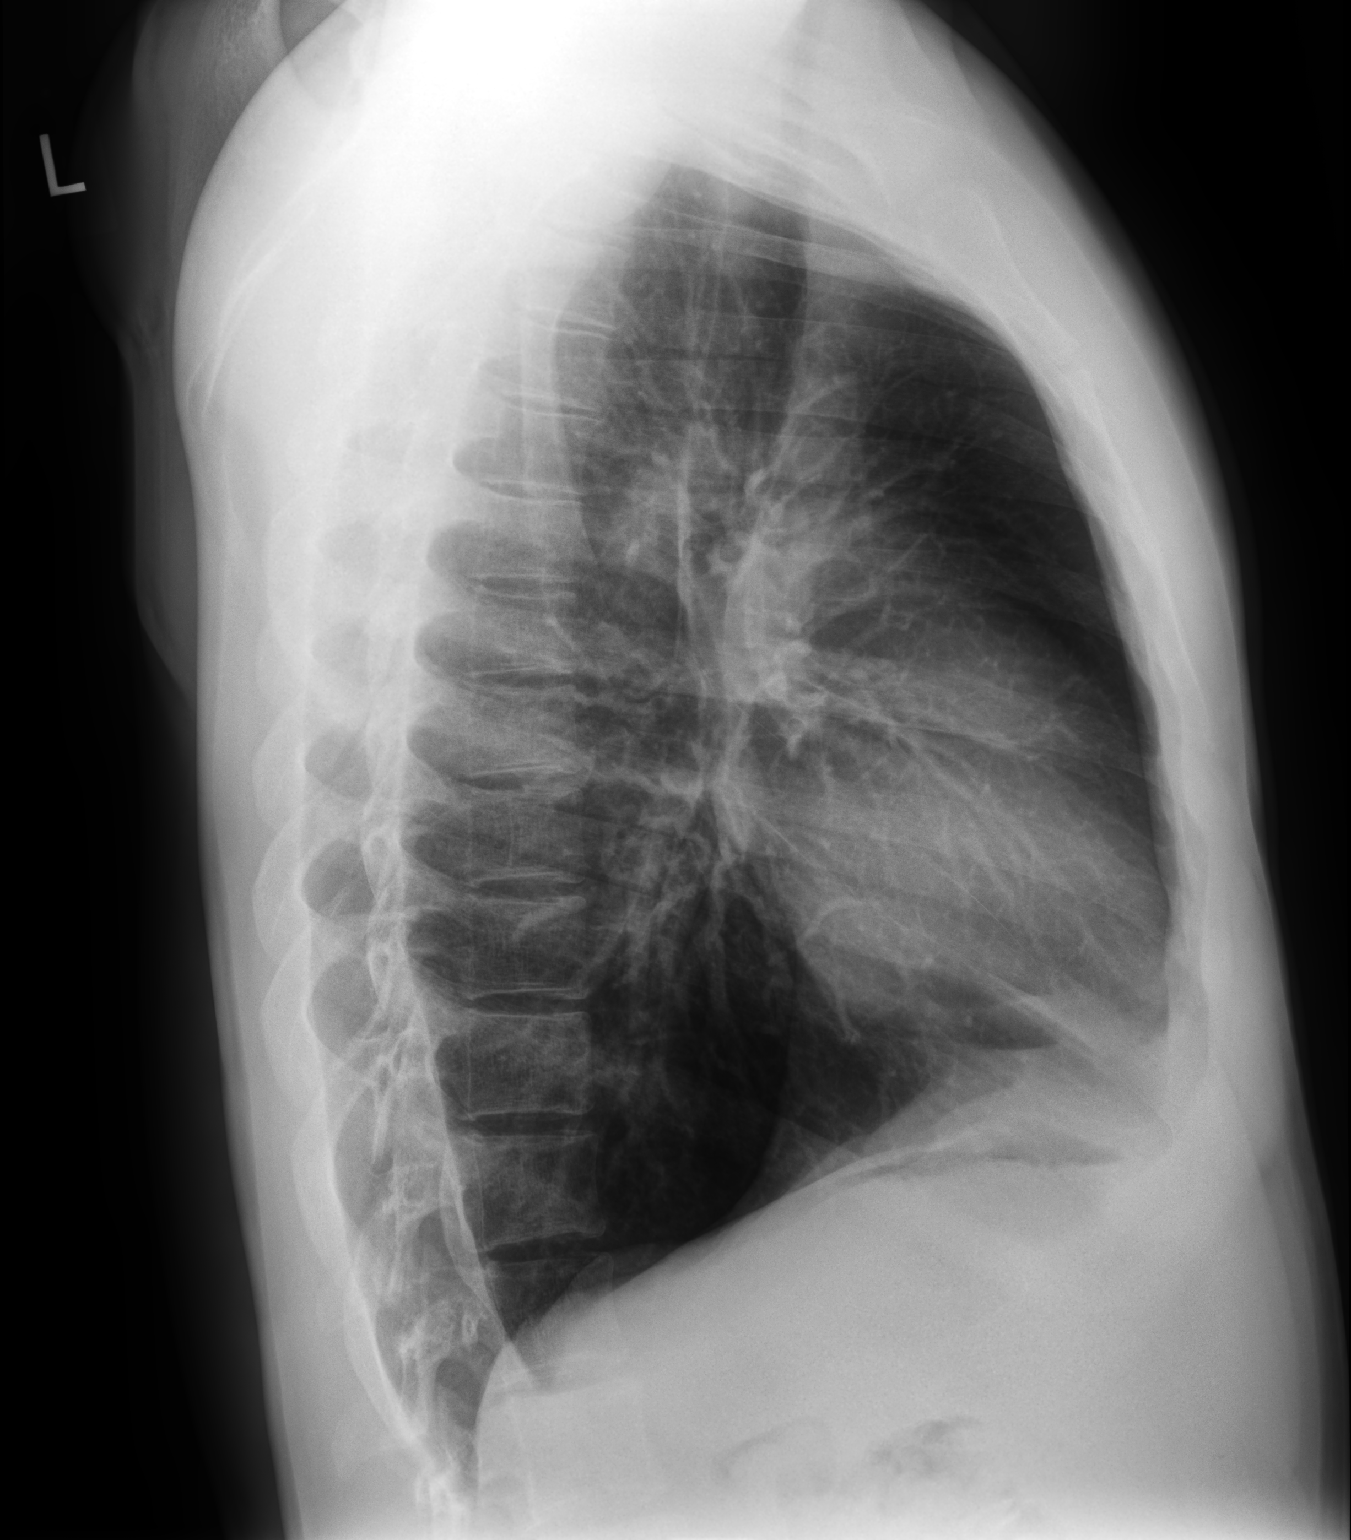

[2 of 2 positions shown; findings below may reference images not displayed]

FINDINGS: Mediastinal contours remain normal. Lung volumes are large which may
reflect good inspiratory effort. On the frontal view only there is
suggestion of vague asymmetric increased lower lung peribronchial
opacity, but the left lung base was normal on the recent CT. Lung
parenchyma elsewhere appears stable and negative. No pneumothorax or
pleural effusion. Negative visible osseous structures and bowel gas
pattern.
IMPRESSION: No cardiopulmonary abnormality.
